# Patient Record
Sex: Female | Born: 1975 | Race: Asian | Hispanic: No | Marital: Married | Smoking: Never smoker
Health system: Southern US, Community
[De-identification: ages and names within clinical notes are randomized; demographics above are authoritative.]

## PROBLEM LIST (undated history)

## (undated) DIAGNOSIS — Z789 Other specified health status: Secondary | ICD-10-CM

## (undated) HISTORY — PX: NO PAST SURGERIES: SHX2092

---

## 2016-05-15 LAB — OB RESULTS CONSOLE HGB/HCT, BLOOD
HCT: 38 %
HEMOGLOBIN: 12.5 g/dL

## 2016-05-15 LAB — OB RESULTS CONSOLE HIV ANTIBODY (ROUTINE TESTING): HIV: NONREACTIVE

## 2016-05-15 LAB — OB RESULTS CONSOLE ABO/RH: RH Type: POSITIVE

## 2016-05-15 LAB — OB RESULTS CONSOLE PLATELET COUNT: PLATELETS: 289 10*3/uL

## 2016-05-15 LAB — GLUCOSE, 1 HOUR
Glucose 1 Hr Prenatal, POC: 143 mg/dL
Glucose 1 Hr Prenatal, POC: 143 mg/dL

## 2016-05-15 LAB — OB RESULTS CONSOLE RPR: RPR: NONREACTIVE

## 2016-05-15 LAB — OB RESULTS CONSOLE HEPATITIS B SURFACE ANTIGEN: Hepatitis B Surface Ag: NEGATIVE

## 2016-05-15 LAB — OB RESULTS CONSOLE ANTIBODY SCREEN: ANTIBODY SCREEN: NEGATIVE

## 2016-05-15 LAB — OB RESULTS CONSOLE RUBELLA ANTIBODY, IGM: RUBELLA: IMMUNE

## 2016-05-23 LAB — GLUCOSE, 1 HOUR
GLUCOSE 2 HR PP, POC: 131 mg/dL
GLUCOSE 3 HOUR GTT: 76 mg/dL (ref ?–140)
Glucose Tolerance, Fasting: 67

## 2016-06-19 NOTE — L&D Delivery Note (Signed)
Delivery Note At 8:27 AM a viable female was delivered via Vaginal, Spontaneous Delivery (Presentation: LOA;  ).  APGAR: 9, 9; weight 6 lb 3.7 oz (2825 g).   After 1 minute, the cord was clamped and cut. 40 units of pitocin diluted in 1000cc LR was infused rapidly IV.  The placenta separated spontaneously and delivered via CCT and maternal pushing effort.  It was inspected and appears to be intact with a 3 VC.  Anesthesia:  local Episiotomy: None Lacerations: 2nd degree;Perineal Suture Repair: 2-0 vicryl Est. Blood Loss (mL): 745   PPH d/t uterine atony.  Bladder emptied, IM pitocin, PR cytotec, IM metergine, and IV pitocin bolus,  Mom to postpartum.  Baby to Couplet care / Skin to Skin.  CRESENZO-DISHMAN,Mattheus Rauls 07/14/2016, 9:19 AM

## 2016-06-21 ENCOUNTER — Ambulatory Visit (INDEPENDENT_AMBULATORY_CARE_PROVIDER_SITE_OTHER): Payer: Medicaid Other | Admitting: Advanced Practice Midwife

## 2016-06-21 ENCOUNTER — Other Ambulatory Visit (HOSPITAL_COMMUNITY)
Admission: RE | Admit: 2016-06-21 | Discharge: 2016-06-21 | Disposition: A | Discharge status: Home / Self Care | Payer: Medicaid Other | Source: Ambulatory Visit | Attending: Advanced Practice Midwife | Admitting: Advanced Practice Midwife

## 2016-06-21 ENCOUNTER — Encounter: Payer: Self-pay | Admitting: Advanced Practice Midwife

## 2016-06-21 VITALS — BP 116/74 | HR 108 | Ht 59.0 in | Wt 131.6 lb

## 2016-06-21 DIAGNOSIS — O4703 False labor before 37 completed weeks of gestation, third trimester: Secondary | ICD-10-CM

## 2016-06-21 DIAGNOSIS — Z113 Encounter for screening for infections with a predominantly sexual mode of transmission: Secondary | ICD-10-CM | POA: Insufficient documentation

## 2016-06-21 DIAGNOSIS — O09899 Supervision of other high risk pregnancies, unspecified trimester: Secondary | ICD-10-CM

## 2016-06-21 DIAGNOSIS — O0933 Supervision of pregnancy with insufficient antenatal care, third trimester: Secondary | ICD-10-CM

## 2016-06-21 DIAGNOSIS — O09523 Supervision of elderly multigravida, third trimester: Secondary | ICD-10-CM

## 2016-06-21 DIAGNOSIS — O0930 Supervision of pregnancy with insufficient antenatal care, unspecified trimester: Secondary | ICD-10-CM | POA: Insufficient documentation

## 2016-06-21 DIAGNOSIS — O09529 Supervision of elderly multigravida, unspecified trimester: Secondary | ICD-10-CM

## 2016-06-21 DIAGNOSIS — Z3A33 33 weeks gestation of pregnancy: Secondary | ICD-10-CM

## 2016-06-21 LAB — POCT URINALYSIS DIP (DEVICE)
Bilirubin Urine: NEGATIVE
GLUCOSE, UA: NEGATIVE mg/dL
HGB URINE DIPSTICK: NEGATIVE
KETONES UR: NEGATIVE mg/dL
Nitrite: NEGATIVE
PROTEIN: NEGATIVE mg/dL
Urobilinogen, UA: 0.2 mg/dL (ref 0.0–1.0)
pH: 6.5 (ref 5.0–8.0)

## 2016-06-21 MED ORDER — CONCEPT OB 130-92.4-1 MG PO CAPS: 1.0000 | ORAL_CAPSULE | Freq: Every day | ORAL | 12 refills | Status: DC

## 2016-06-21 NOTE — Patient Instructions (Addendum)
Breastfeeding Deciding to breastfeed is one of the best choices you can make for you and your baby. A change in hormones during pregnancy causes your breast tissue to grow and increases the number and size of your milk ducts. These hormones also allow proteins, sugars, and fats from your blood supply to make breast milk in your milk-producing glands. Hormones prevent breast milk from being released before your baby is born as well as prompt milk flow after birth. Once breastfeeding has begun, thoughts of your baby, as well as his or her sucking or crying, can stimulate the release of milk from your milk-producing glands. Benefits of breastfeeding For Your Baby  Your first milk (colostrum) helps your baby's digestive system function better.  There are antibodies in your milk that help your baby fight off infections.  Your baby has a lower incidence of asthma, allergies, and sudden infant death syndrome.  The nutrients in breast milk are better for your baby than infant formulas and are designed uniquely for your baby's needs.  Breast milk improves your baby's brain development.  Your baby is less likely to develop other conditions, such as childhood obesity, asthma, or type 2 diabetes mellitus. For You  Breastfeeding helps to create a very special bond between you and your baby.  Breastfeeding is convenient. Breast milk is always available at the correct temperature and costs nothing.  Breastfeeding helps to burn calories and helps you lose the weight gained during pregnancy.  Breastfeeding makes your uterus contract to its prepregnancy size faster and slows bleeding (lochia) after you give birth.  Breastfeeding helps to lower your risk of developing type 2 diabetes mellitus, osteoporosis, and breast or ovarian cancer later in life. Signs that your baby is hungry Early Signs of Hunger  Increased alertness or activity.  Stretching.  Movement of the head from side to  side.  Movement of the head and opening of the mouth when the corner of the mouth or cheek is stroked (rooting).  Increased sucking sounds, smacking lips, cooing, sighing, or squeaking.  Hand-to-mouth movements.  Increased sucking of fingers or hands. Late Signs of Hunger  Fussing.  Intermittent crying. Extreme Signs of Hunger  Signs of extreme hunger will require calming and consoling before your baby will be able to breastfeed successfully. Do not wait for the following signs of extreme hunger to occur before you initiate breastfeeding:  Restlessness.  A loud, strong cry.  Screaming. Breastfeeding basics  Breastfeeding Initiation  Find a comfortable place to sit or lie down, with your neck and back well supported.  Place a pillow or rolled up blanket under your baby to bring him or her to the level of your breast (if you are seated). Nursing pillows are specially designed to help support your arms and your baby while you breastfeed.  Make sure that your baby's abdomen is facing your abdomen.  Gently massage your breast. With your fingertips, massage from your chest wall toward your nipple in a circular motion. This encourages milk flow. You may need to continue this action during the feeding if your milk flows slowly.  Support your breast with 4 fingers underneath and your thumb above your nipple. Make sure your fingers are well away from your nipple and your baby's mouth.  Stroke your baby's lips gently with your finger or nipple.  When your baby's mouth is open wide enough, quickly bring your baby to your breast, placing your entire nipple and as much of the colored area around your  nipple (areola) as possible into your baby's mouth.  More areola should be visible above your baby's upper lip than below the lower lip.  Your baby's tongue should be between his or her lower gum and your breast.  Ensure that your baby's mouth is correctly positioned around your nipple  (latched). Your baby's lips should create a seal on your breast and be turned out (everted).  It is common for your baby to suck about 2-3 minutes in order to start the flow of breast milk. Latching  Teaching your baby how to latch on to your breast properly is very important. An improper latch can cause nipple pain and decreased milk supply for you and poor weight gain in your baby. Also, if your baby is not latched onto your nipple properly, he or she may swallow some air during feeding. This can make your baby fussy. Burping your baby when you switch breasts during the feeding can help to get rid of the air. However, teaching your baby to latch on properly is still the best way to prevent fussiness from swallowing air while breastfeeding. Signs that your baby has successfully latched on to your nipple:  Silent tugging or silent sucking, without causing you pain.  Swallowing heard between every 3-4 sucks.  Muscle movement above and in front of his or her ears while sucking. Signs that your baby has not successfully latched on to nipple:  Sucking sounds or smacking sounds from your baby while breastfeeding.  Nipple pain. If you think your baby has not latched on correctly, slip your finger into the corner of your baby's mouth to break the suction and place it between your baby's gums. Attempt breastfeeding initiation again. Signs of Successful Breastfeeding  Signs from your baby:  A gradual decrease in the number of sucks or complete cessation of sucking.  Falling asleep.  Relaxation of his or her body.  Retention of a small amount of milk in his or her mouth.  Letting go of your breast by himself or herself. Signs from you:  Breasts that have increased in firmness, weight, and size 1-3 hours after feeding.  Breasts that are softer immediately after breastfeeding.  Increased milk volume, as well as a change in milk consistency and color by the fifth day of  breastfeeding.  Nipples that are not sore, cracked, or bleeding. Signs That Your Pecola Leisure is Getting Enough Milk  Wetting at least 1-2 diapers during the first 24 hours after birth.  Wetting at least 5-6 diapers every 24 hours for the first week after birth. The urine should be clear or pale yellow by 5 days after birth.  Wetting 6-8 diapers every 24 hours as your baby continues to grow and develop.  At least 3 stools in a 24-hour period by age 84 days. The stool should be soft and yellow.  At least 3 stools in a 24-hour period by age 19 days. The stool should be seedy and yellow.  No loss of weight greater than 10% of birth weight during the first 73 days of age.  Average weight gain of 4-7 ounces (113-198 g) per week after age 64 days.  Consistent daily weight gain by age 84 days, without weight loss after the age of 2 weeks. After a feeding, your baby may spit up a small amount. This is common. Breastfeeding frequency and duration Frequent feeding will help you make more milk and can prevent sore nipples and breast engorgement. Breastfeed when you feel the need to reduce  the fullness of your breasts or when your baby shows signs of hunger. This is called "breastfeeding on demand." Avoid introducing a pacifier to your baby while you are working to establish breastfeeding (the first 4-6 weeks after your baby is born). After this time you may choose to use a pacifier. Research has shown that pacifier use during the first year of a baby's life decreases the risk of sudden infant death syndrome (SIDS). Allow your baby to feed on each breast as long as he or she wants. Breastfeed until your baby is finished feeding. When your baby unlatches or falls asleep while feeding from the first breast, offer the second breast. Because newborns are often sleepy in the first few weeks of life, you may need to awaken your baby to get him or her to feed. Breastfeeding times will vary from baby to baby. However, the  following rules can serve as a guide to help you ensure that your baby is properly fed:  Newborns (babies 7 weeks of age or younger) may breastfeed every 1-3 hours.  Newborns should not go longer than 3 hours during the day or 5 hours during the night without breastfeeding.  You should breastfeed your baby a minimum of 8 times in a 24-hour period until you begin to introduce solid foods to your baby at around 66 months of age. Breast milk pumping Pumping and storing breast milk allows you to ensure that your baby is exclusively fed your breast milk, even at times when you are unable to breastfeed. This is especially important if you are going back to work while you are still breastfeeding or when you are not able to be present during feedings. Your lactation consultant can give you guidelines on how long it is safe to store breast milk. A breast pump is a machine that allows you to pump milk from your breast into a sterile bottle. The pumped breast milk can then be stored in a refrigerator or freezer. Some breast pumps are operated by hand, while others use electricity. Ask your lactation consultant which type will work best for you. Breast pumps can be purchased, but some hospitals and breastfeeding support groups lease breast pumps on a monthly basis. A lactation consultant can teach you how to hand express breast milk, if you prefer not to use a pump. Caring for your breasts while you breastfeed Nipples can become dry, cracked, and sore while breastfeeding. The following recommendations can help keep your breasts moisturized and healthy:  Avoid using soap on your nipples.  Wear a supportive bra. Although not required, special nursing bras and tank tops are designed to allow access to your breasts for breastfeeding without taking off your entire bra or top. Avoid wearing underwire-style bras or extremely tight bras.  Air dry your nipples for 3-23minutes after each feeding.  Use only cotton bra  pads to absorb leaked breast milk. Leaking of breast milk between feedings is normal.  Use lanolin on your nipples after breastfeeding. Lanolin helps to maintain your skin's normal moisture barrier. If you use pure lanolin, you do not need to wash it off before feeding your baby again. Pure lanolin is not toxic to your baby. You may also hand express a few drops of breast milk and gently massage that milk into your nipples and allow the milk to air dry. In the first few weeks after giving birth, some women experience extremely full breasts (engorgement). Engorgement can make your breasts feel heavy, warm, and tender to the  touch. Engorgement peaks within 3-5 days after you give birth. The following recommendations can help ease engorgement:  Completely empty your breasts while breastfeeding or pumping. You may want to start by applying warm, moist heat (in the shower or with warm water-soaked hand towels) just before feeding or pumping. This increases circulation and helps the milk flow. If your baby does not completely empty your breasts while breastfeeding, pump any extra milk after he or she is finished.  Wear a snug bra (nursing or regular) or tank top for 1-2 days to signal your body to slightly decrease milk production.  Apply ice packs to your breasts, unless this is too uncomfortable for you.  Make sure that your baby is latched on and positioned properly while breastfeeding. If engorgement persists after 48 hours of following these recommendations, contact your health care provider or a Advertising copywriter. Overall health care recommendations while breastfeeding  Eat healthy foods. Alternate between meals and snacks, eating 3 of each per day. Because what you eat affects your breast milk, some of the foods may make your baby more irritable than usual. Avoid eating these foods if you are sure that they are negatively affecting your baby.  Drink milk, fruit juice, and water to satisfy your  thirst (about 10 glasses a day).  Rest often, relax, and continue to take your prenatal vitamins to prevent fatigue, stress, and anemia.  Continue breast self-awareness checks.  Avoid chewing and smoking tobacco. Chemicals from cigarettes that pass into breast milk and exposure to secondhand smoke may harm your baby.  Avoid alcohol and drug use, including marijuana. Some medicines that may be harmful to your baby can pass through breast milk. It is important to ask your health care provider before taking any medicine, including all over-the-counter and prescription medicine as well as vitamin and herbal supplements. It is possible to become pregnant while breastfeeding. If birth control is desired, ask your health care provider about options that will be safe for your baby. Contact a health care provider if:  You feel like you want to stop breastfeeding or have become frustrated with breastfeeding.  You have painful breasts or nipples.  Your nipples are cracked or bleeding.  Your breasts are red, tender, or warm.  You have a swollen area on either breast.  You have a fever or chills.  You have nausea or vomiting.  You have drainage other than breast milk from your nipples.  Your breasts do not become full before feedings by the fifth day after you give birth.  You feel sad and depressed.  Your baby is too sleepy to eat well.  Your baby is having trouble sleeping.  Your baby is wetting less than 3 diapers in a 24-hour period.  Your baby has less than 3 stools in a 24-hour period.  Your baby's skin or the white part of his or her eyes becomes yellow.  Your baby is not gaining weight by 9 days of age. Get help right away if:  Your baby is overly tired (lethargic) and does not want to wake up and feed.  Your baby develops an unexplained fever. This information is not intended to replace advice given to you by your health care provider. Make sure you discuss any questions  you have with your health care provider. Document Released: 06/05/2005 Document Revised: 11/17/2015 Document Reviewed: 11/27/2012 Elsevier Interactive Patient Education  2017 [employer].   Contraception Choices Contraception (birth control) is the use of any methods or devices to  prevent pregnancy. Below are some methods to help avoid pregnancy. Hormonal methods  Contraceptive implant. This is a thin, plastic tube containing progesterone hormone. It does not contain estrogen hormone. Your health care provider inserts the tube in the inner part of the upper arm. The tube can remain in place for up to 3 years. After 3 years, the implant must be removed. The implant prevents the ovaries from releasing an egg (ovulation), thickens the cervical mucus to prevent sperm from entering the uterus, and thins the lining of the inside of the uterus.  Progesterone-only injections. These injections are given every 3 months by your health care provider to prevent pregnancy. This synthetic progesterone hormone stops the ovaries from releasing eggs. It also thickens cervical mucus and changes the uterine lining. This makes it harder for sperm to survive in the uterus.  Birth control pills. These pills contain estrogen and progesterone hormone. They work by preventing the ovaries from releasing eggs (ovulation). They also cause the cervical mucus to thicken, preventing the sperm from entering the uterus. Birth control pills are prescribed by a health care provider.Birth control pills can also be used to treat heavy periods.  Minipill. This type of birth control pill contains only the progesterone hormone. They are taken every day of each month and must be prescribed by your health care provider.  Birth control patch. The patch contains hormones similar to those in birth control pills. It must be changed once a week and is prescribed by a health care provider.  Vaginal ring. The ring contains hormones similar to  those in birth control pills. It is left in the vagina for 3 weeks, removed for 1 week, and then a new one is put back in place. The patient must be comfortable inserting and removing the ring from the vagina.A health care provider's prescription is necessary.  Emergency contraception. Emergency contraceptives prevent pregnancy after unprotected sexual intercourse. This pill can be taken right after sex or up to 5 days after unprotected sex. It is most effective the sooner you take the pills after having sexual intercourse. Most emergency contraceptive pills are available without a prescription. Check with your pharmacist. Do not use emergency contraception as your only form of birth control. Barrier methods  Female condom. This is a thin sheath (latex or rubber) that is worn over the penis during sexual intercourse. It can be used with spermicide to increase effectiveness.  Female condom. This is a soft, loose-fitting sheath that is put into the vagina before sexual intercourse.  Diaphragm. This is a soft, latex, dome-shaped barrier that must be fitted by a health care provider. It is inserted into the vagina, along with a spermicidal jelly. It is inserted before intercourse. The diaphragm should be left in the vagina for 6 to 8 hours after intercourse.  Cervical cap. This is a round, soft, latex or plastic cup that fits over the cervix and must be fitted by a health care provider. The cap can be left in place for up to 48 hours after intercourse.  Sponge. This is a soft, circular piece of polyurethane foam. The sponge has spermicide in it. It is inserted into the vagina after wetting it and before sexual intercourse.  Spermicides. These are chemicals that kill or block sperm from entering the cervix and uterus. They come in the form of creams, jellies, suppositories, foam, or tablets. They do not require a prescription. They are inserted into the vagina with an applicator before having sexual  intercourse. The  process must be repeated every time you have sexual intercourse. Intrauterine contraception  Intrauterine device (IUD). This is a T-shaped device that is put in a woman's uterus during a menstrual period to prevent pregnancy. There are 2 types:  Copper IUD. This type of IUD is wrapped in copper wire and is placed inside the uterus. Copper makes the uterus and fallopian tubes produce a fluid that kills sperm. It can stay in place for 10 years.  Hormone IUD. This type of IUD contains the hormone progestin (synthetic progesterone). The hormone thickens the cervical mucus and prevents sperm from entering the uterus, and it also thins the uterine lining to prevent implantation of a fertilized egg. The hormone can weaken or kill the sperm that get into the uterus. It can stay in place for 3-5 years, depending on which type of IUD is used. Permanent methods of contraception  Female tubal ligation. This is when the woman's fallopian tubes are surgically sealed, tied, or blocked to prevent the egg from traveling to the uterus.  Hysteroscopic sterilization. This involves placing a small coil or insert into each fallopian tube. Your doctor uses a technique called hysteroscopy to do the procedure. The device causes scar tissue to form. This results in permanent blockage of the fallopian tubes, so the sperm cannot fertilize the egg. It takes about 3 months after the procedure for the tubes to become blocked. You must use another form of birth control for these 3 months.  Female sterilization. This is when the female has the tubes that carry sperm tied off (vasectomy).This blocks sperm from entering the vagina during sexual intercourse. After the procedure, the man can still ejaculate fluid (semen). Natural planning methods  Natural family planning. This is not having sexual intercourse or using a barrier method (condom, diaphragm, cervical cap) on days the woman could become pregnant.  Calendar  method. This is keeping track of the length of each menstrual cycle and identifying when you are fertile.  Ovulation method. This is avoiding sexual intercourse during ovulation.  Symptothermal method. This is avoiding sexual intercourse during ovulation, using a thermometer and ovulation symptoms.  Post-ovulation method. This is timing sexual intercourse after you have ovulated. Regardless of which type or method of contraception you choose, it is important that you use condoms to protect against the transmission of sexually transmitted infections (STIs). Talk with your health care provider about which form of contraception is most appropriate for you. This information is not intended to replace advice given to you by your health care provider. Make sure you discuss any questions you have with your health care provider. Document Released: 06/05/2005 Document Revised: 11/11/2015 Document Reviewed: 11/28/2012 Elsevier Interactive Patient Education  2017 [employer].    AREA PEDIATRIC/FAMILY PRACTICE PHYSICIANS  [facility] [address] [place], [place]  [address] Phone - [phone]   Fax - [phone]  [facility] [address] [place], [place] [address] Phone - [phone]   Fax - [phone]  [facility] [address] [place], [place]  [address] Phone - [phone]   Fax - [phone]  [facility] [address] [place], [place]  [address] Phone - [phone]   Fax - [phone]  [facility] [address] [place], [place]  [address] Phone - [phone]   Fax - [phone]  [facility] [address] [place], [place]  [address] Phone - [phone]   Fax - [phone]  [facility] [address] [place], [place]  [address] Phone - [phone]   Fax - [phone]  [facility] AT [facility] [address] [place], [place]  [address] Phone - [phone]   Fax - [phone]  [facility] AT [facility] [address] [place], [place]  [address] Phone - [phone]   Fax - [phone] [facility] AT [place] [address] [place], [place]  [address] Phone - [phone]   Fax - [phone]  [facility] [address] [place], [place]  [address] Phone - [phone]   Fax - [phone]  [facility] [address] [place], [place]  [address] Phone - [phone]   Fax - [phone]  [facility] [place] [address] [place], [place]  [address] Phone - [phone]   Fax - [phone]  [facility] [address] [place], [place]  [address] Phone - [phone]  [facility] [address] [place], [place]  [address] Phone - [phone]   Fax - [phone]  [facility] [address] [place], [place]  [address] Phone - [phone]   Fax - [phone]  [facility] [address] [place], [place]  [address] Phone - [phone]   Fax - [phone]  [facility] [address] [place], [place]  [address] Phone - [phone]   Fax - [phone]   [facility] [address] [place], [place]  [address] Phone - [phone]   Fax - [phone]  [facility] [address] [place], [place]  [address] Phone - [phone]   Fax - [phone]  DAVID RUBIN [address] [place], [place]  [address] Phone - [phone]   Fax - [phone]  [facility] [address] [place], [place]  [address] Phone - [phone]   Fax - [phone]  [facility] [address] [place], [place]  [address] Phone - [phone]   Fax - [phone] Gerarda Fraction [address] [place], [place]  [address] Phone - [phone]   Fax - [phone]  [facility] [address] [place], [place]  [address] Phone - [phone]   Fax - [phone]  [facility] [place] [address] [place], [place]  [address] Phone - [phone]   Fax - [phone]

## 2016-06-21 NOTE — Progress Notes (Signed)
   PRENATAL VISIT NOTE  Subjective:  Laura Cuevas is a 41 y.o. G1P0 at 6344w2d by LMP and 15 week US per pt being seen today for NOB transfer of [place] from [place].Had limited PNC (2-3 visit) before moving to [place]. Records reviewed. All prenatal labs up to date except Patient did not keep appt for anatomy US. States last Pap was early 2017 in [place]. Denies HX abnl Pap. She is currently monitored for the following issues for this high-risk pregnancy and has Supervision of other high risk pregnancy, antepartum; AMA (advanced maternal age) multigravida 35+, unspecified trimester; and Limited prenatal care, antepartum on her problem list.  Patient reports occasional contractions and pelvic pressure, burning w/ urination.  Contractions: few Vag. Bleeding: None.  Movement: Present. Denies leaking of fluid.   The following portions of the patient's history were reviewed and updated as appropriate: allergies, current medications, past family history, past medical history, past social history, past surgical history and problem list. Problem list updated.  Objective:   Vitals:   06/21/16 0947 06/21/16 0949  BP: 116/74   Pulse: (!) 108   Weight: 131 lb 9.6 oz (59.7 kg)   Height:  4\' 11"  (1.499 m)    Fetal Status: Fetal Heart Rate (bpm): 145 Fundal Height: 33 cm Movement: Present  Presentation: Vertex  General:  Alert, oriented and cooperative. Patient is in no acute distress.  Skin: Skin is warm and dry. No rash noted.   Cardiovascular: Normal heart rate noted  Respiratory: Normal respiratory effort, no problems with respiration noted  Abdomen: Soft, gravid, appropriate for gestational age. Pain/Pressure: Present     Pelvic:  Cervical exam performed Dilation: Closed Effacement (%): 0 Station: -3. Moderate amount of thick white. Curd-like discharge C/W VVC  Extremities: Normal range of motion.  Edema: None  Mental Status: Normal mood and affect. Normal behavior. Normal judgment and thought content.    Assessment and Plan:  Pregnancy: G1P0 at 744w2d  1. Encounter for supervision of normal pregnancy, antepartum, unspecified gravidity  - Pain Mgmt, Profile 6 Conf w/o mM, U - US MFM OB DETAIL +14 WK; Future  2. Supervision of other high risk pregnancy, antepartum  - US MFM OB DETAIL +14 WK; Future  3. AMA (advanced maternal age) multigravida 35+, unspecified trimester  - US MFM OB DETAIL +14 WK; Future  4. Limited prenatal care, antepartum  - US MFM OB DETAIL +14 WK; Future  5. [redacted] weeks gestation of pregnancy  - US MFM OB DETAIL +14 WK; Future  6. Preterm uterine contractions in third trimester, antepartum - fFN collected, but discarded due to closed, long cervix - Wet prep, genital - GC[facility]Chlamydia probe amp [facility] - Culture, OB Urine  7. VVC - OTC Monistat PRN  Preterm labor symptoms and general obstetric precautions including but not limited to vaginal bleeding, contractions, leaking of fluid and fetal movement were reviewed in detail with the patient. Please refer to After Visit Summary for other counseling recommendations.   Return in 2 weeks (on 07/05/2016).   Dorathy KinsmanVirginia Ebunoluwa Gernert, CNM

## 2016-06-21 NOTE — Progress Notes (Signed)
Here for first visit. Transferring care . Given new patient information. Declines flu shop and tdap.

## 2016-06-22 LAB — PAIN MGMT, PROFILE 6 CONF W/O MM, U
6 ACETYLMORPHINE: NEGATIVE ng/mL (ref <10)
AMPHETAMINES: NEGATIVE ng/mL (ref <500)
Alcohol Metabolites: NEGATIVE ng/mL (ref <500)
BARBITURATES: NEGATIVE ng/mL (ref <300)
BENZODIAZEPINES: NEGATIVE ng/mL (ref <100)
COCAINE METABOLITE: NEGATIVE ng/mL (ref <150)
Creatinine: 37.8 mg/dL (ref >=20.0)
MARIJUANA METABOLITE: NEGATIVE ng/mL (ref <20)
METHADONE METABOLITE: NEGATIVE ng/mL (ref <100)
OXYCODONE: NEGATIVE ng/mL (ref <100)
Opiates: NEGATIVE ng/mL (ref <100)
Oxidant: NEGATIVE ug/mL (ref <200)
PHENCYCLIDINE: NEGATIVE ng/mL (ref <25)
Please note:: 0
pH: 6.56 (ref 4.5–9.0)

## 2016-06-22 LAB — WET PREP, GENITAL: Trich, Wet Prep: NONE SEEN

## 2016-06-22 LAB — GC/CHLAMYDIA PROBE AMP (~~LOC~~) NOT AT ARMC
Chlamydia: NEGATIVE
NEISSERIA GONORRHEA: NEGATIVE

## 2016-06-22 LAB — CULTURE, OB URINE: ORGANISM ID, BACTERIA: NO GROWTH

## 2016-06-23 ENCOUNTER — Other Ambulatory Visit: Payer: Self-pay | Admitting: Advanced Practice Midwife

## 2016-06-23 NOTE — Progress Notes (Unsigned)
Dx BV, VVC. Rx Terazol, Flagyl.

## 2016-06-26 ENCOUNTER — Telehealth: Payer: Self-pay | Admitting: *Deleted

## 2016-06-26 DIAGNOSIS — B9689 Other specified bacterial agents as the cause of diseases classified elsewhere: Secondary | ICD-10-CM

## 2016-06-26 DIAGNOSIS — B373 Candidiasis of vulva and vagina: Secondary | ICD-10-CM

## 2016-06-26 DIAGNOSIS — N760 Acute vaginitis: Secondary | ICD-10-CM

## 2016-06-26 DIAGNOSIS — B3731 Acute candidiasis of vulva and vagina: Secondary | ICD-10-CM

## 2016-06-26 MED ORDER — TERCONAZOLE 0.4 % VA CREA: 1.0000 | TOPICAL_CREAM | Freq: Every day | VAGINAL | 0 refills | Status: AC

## 2016-06-26 MED ORDER — METRONIDAZOLE 500 MG PO TABS: 500.0000 mg | ORAL_TABLET | Freq: Two times a day (BID) | ORAL | 0 refills | Status: DC

## 2016-06-26 NOTE — Telephone Encounter (Signed)
Per message from Laura Cuevas, Laura Cuevas need to call patient and notified of + BV and yeast and send prescriptions for flagyl and terazol to pharmacy of her choice ( no pharmacy on file) I called Laura Cuevas and she states Laura Cuevas told her about yeast at her ob visit. States she has not gotten the [facility] and wants me to send rx to phamacy which I did. She voices understanding.

## 2016-06-28 ENCOUNTER — Encounter (HOSPITAL_COMMUNITY): Payer: Self-pay

## 2016-06-28 ENCOUNTER — Ambulatory Visit (HOSPITAL_COMMUNITY)
Admission: RE | Admit: 2016-06-28 | Discharge: 2016-06-28 | Disposition: A | Discharge status: Home / Self Care | Payer: Medicaid Other | Source: Ambulatory Visit | Attending: Advanced Practice Midwife | Admitting: Advanced Practice Midwife

## 2016-06-28 DIAGNOSIS — O0933 Supervision of pregnancy with insufficient antenatal care, third trimester: Secondary | ICD-10-CM | POA: Insufficient documentation

## 2016-06-28 DIAGNOSIS — O09523 Supervision of elderly multigravida, third trimester: Secondary | ICD-10-CM | POA: Insufficient documentation

## 2016-06-28 DIAGNOSIS — O0930 Supervision of pregnancy with insufficient antenatal care, unspecified trimester: Secondary | ICD-10-CM

## 2016-06-28 DIAGNOSIS — O09899 Supervision of other high risk pregnancies, unspecified trimester: Secondary | ICD-10-CM

## 2016-06-28 DIAGNOSIS — Z3689 Encounter for other specified antenatal screening: Secondary | ICD-10-CM | POA: Insufficient documentation

## 2016-06-28 DIAGNOSIS — O09529 Supervision of elderly multigravida, unspecified trimester: Secondary | ICD-10-CM

## 2016-06-28 DIAGNOSIS — Z3A33 33 weeks gestation of pregnancy: Secondary | ICD-10-CM

## 2016-06-28 DIAGNOSIS — Z3A34 34 weeks gestation of pregnancy: Secondary | ICD-10-CM | POA: Diagnosis not present

## 2016-06-28 HISTORY — DX: Other specified health status: Z78.9

## 2016-07-05 ENCOUNTER — Encounter: Payer: Medicaid Other | Admitting: Family Medicine

## 2016-07-12 ENCOUNTER — Ambulatory Visit (INDEPENDENT_AMBULATORY_CARE_PROVIDER_SITE_OTHER): Payer: Medicaid Other | Admitting: Advanced Practice Midwife

## 2016-07-12 VITALS — BP 120/72 | HR 91 | Wt 136.0 lb

## 2016-07-12 DIAGNOSIS — Z3403 Encounter for supervision of normal first pregnancy, third trimester: Secondary | ICD-10-CM

## 2016-07-12 DIAGNOSIS — O09523 Supervision of elderly multigravida, third trimester: Secondary | ICD-10-CM

## 2016-07-12 DIAGNOSIS — O0993 Supervision of high risk pregnancy, unspecified, third trimester: Secondary | ICD-10-CM

## 2016-07-12 DIAGNOSIS — O09899 Supervision of other high risk pregnancies, unspecified trimester: Secondary | ICD-10-CM

## 2016-07-12 DIAGNOSIS — O0933 Supervision of pregnancy with insufficient antenatal care, third trimester: Secondary | ICD-10-CM | POA: Diagnosis not present

## 2016-07-12 DIAGNOSIS — O4703 False labor before 37 completed weeks of gestation, third trimester: Secondary | ICD-10-CM | POA: Diagnosis not present

## 2016-07-12 NOTE — Progress Notes (Signed)
   PRENATAL VISIT NOTE  Subjective:  Laura Cuevas is a 41 y.o. G1P0 at 4381w2d being seen today for ongoing prenatal care.  She is currently monitored for the following issues for this high-risk pregnancy and has Supervision of other high risk pregnancy, antepartum; AMA (advanced maternal age) multigravida 35+, unspecified trimester; and Limited prenatal care, antepartum on her problem list.  Patient reports occasional contractions.  Contractions: Not present. Vag. Bleeding: None.  Movement: Present. Denies leaking of fluid.   Plans to decline vaccines for baby. Wants to know what will be offered in hospital.   The following portions of the patient's history were reviewed and updated as appropriate: allergies, current medications, past family history, past medical history, past social history, past surgical history and problem list. Problem list updated.  Objective:   Vitals:   07/12/16 0938  BP: 120/72  Pulse: 91  Weight: 136 lb (61.7 kg)    Fetal Status: Fetal Heart Rate (bpm): 126 Fundal Height: 36 cm Movement: Present  Presentation: Vertex  General:  Alert, oriented and cooperative. Patient is in no acute distress.  Skin: Skin is warm and dry. No rash noted.   Cardiovascular: Normal heart rate noted  Respiratory: Normal respiratory effort, no problems with respiration noted  Abdomen: Soft, gravid, appropriate for gestational age. Pain/Pressure: Present     Pelvic:  Cervical exam performed Dilation: Closed Effacement (%): 0 Station: -3  Extremities: Normal range of motion.  Edema: None  Mental Status: Normal mood and affect. Normal behavior. Normal judgment and thought content.   Assessment and Plan:  Pregnancy: G1P0 at 6181w2d  1. Supervision of high risk pregnancy in third trimester  - Culture, beta strep (group b only)  2. Encounter for supervision of normal first pregnancy in third trimester   3. Supervision of other high risk pregnancy, antepartum   Term labor symptoms  and general obstetric precautions including but not limited to vaginal bleeding, contractions, leaking of fluid and fetal movement were reviewed in detail with the patient. Please refer to After Visit Summary for other counseling recommendations.  List of circ providers given.  Discussed Hep B vaccine, Vit K and E-mycin eye ointment in hospital. Explained that circ providers will likely refuse to perform elective circ soon after birth on baby that did not get Vit K due to bleeding risk Return in 1 week (on 07/19/2016).   Dorathy KinsmanVirginia Dimitri Dsouza, CNM

## 2016-07-12 NOTE — Patient Instructions (Addendum)
Breastfeeding Deciding to breastfeed is one of the best choices you can make for you and your baby. A change in hormones during pregnancy causes your breast tissue to grow and increases the number and size of your milk ducts. These hormones also allow proteins, sugars, and fats from your blood supply to make breast milk in your milk-producing glands. Hormones prevent breast milk from being released before your baby is born as well as prompt milk flow after birth. Once breastfeeding has begun, thoughts of your baby, as well as his or her sucking or crying, can stimulate the release of milk from your milk-producing glands. Benefits of breastfeeding For Your Baby  Your first milk (colostrum) helps your baby's digestive system function better.  There are antibodies in your milk that help your baby fight off infections.  Your baby has a lower incidence of asthma, allergies, and sudden infant death syndrome.  The nutrients in breast milk are better for your baby than infant formulas and are designed uniquely for your baby's needs.  Breast milk improves your baby's brain development.  Your baby is less likely to develop other conditions, such as childhood obesity, asthma, or type 2 diabetes mellitus. For You  Breastfeeding helps to create a very special bond between you and your baby.  Breastfeeding is convenient. Breast milk is always available at the correct temperature and costs nothing.  Breastfeeding helps to burn calories and helps you lose the weight gained during pregnancy.  Breastfeeding makes your uterus contract to its prepregnancy size faster and slows bleeding (lochia) after you give birth.  Breastfeeding helps to lower your risk of developing type 2 diabetes mellitus, osteoporosis, and breast or ovarian cancer later in life. Signs that your baby is hungry Early Signs of Hunger  Increased alertness or activity.  Stretching.  Movement of the head from side to  side.  Movement of the head and opening of the mouth when the corner of the mouth or cheek is stroked (rooting).  Increased sucking sounds, smacking lips, cooing, sighing, or squeaking.  Hand-to-mouth movements.  Increased sucking of fingers or hands. Late Signs of Hunger  Fussing.  Intermittent crying. Extreme Signs of Hunger  Signs of extreme hunger will require calming and consoling before your baby will be able to breastfeed successfully. Do not wait for the following signs of extreme hunger to occur before you initiate breastfeeding:  Restlessness.  A loud, strong cry.  Screaming. Breastfeeding basics  Breastfeeding Initiation  Find a comfortable place to sit or lie down, with your neck and back well supported.  Place a pillow or rolled up blanket under your baby to bring him or her to the level of your breast (if you are seated). Nursing pillows are specially designed to help support your arms and your baby while you breastfeed.  Make sure that your baby's abdomen is facing your abdomen.  Gently massage your breast. With your fingertips, massage from your chest wall toward your nipple in a circular motion. This encourages milk flow. You may need to continue this action during the feeding if your milk flows slowly.  Support your breast with 4 fingers underneath and your thumb above your nipple. Make sure your fingers are well away from your nipple and your baby's mouth.  Stroke your baby's lips gently with your finger or nipple.  When your baby's mouth is open wide enough, quickly bring your baby to your breast, placing your entire nipple and as much of the colored area around your  nipple (areola) as possible into your baby's mouth.  More areola should be visible above your baby's upper lip than below the lower lip.  Your baby's tongue should be between his or her lower gum and your breast.  Ensure that your baby's mouth is correctly positioned around your nipple  (latched). Your baby's lips should create a seal on your breast and be turned out (everted).  It is common for your baby to suck about 2-3 minutes in order to start the flow of breast milk. Latching  Teaching your baby how to latch on to your breast properly is very important. An improper latch can cause nipple pain and decreased milk supply for you and poor weight gain in your baby. Also, if your baby is not latched onto your nipple properly, he or she may swallow some air during feeding. This can make your baby fussy. Burping your baby when you switch breasts during the feeding can help to get rid of the air. However, teaching your baby to latch on properly is still the best way to prevent fussiness from swallowing air while breastfeeding. Signs that your baby has successfully latched on to your nipple:  Silent tugging or silent sucking, without causing you pain.  Swallowing heard between every 3-4 sucks.  Muscle movement above and in front of his or her ears while sucking. Signs that your baby has not successfully latched on to nipple:  Sucking sounds or smacking sounds from your baby while breastfeeding.  Nipple pain. If you think your baby has not latched on correctly, slip your finger into the corner of your baby's mouth to break the suction and place it between your baby's gums. Attempt breastfeeding initiation again. Signs of Successful Breastfeeding  Signs from your baby:  A gradual decrease in the number of sucks or complete cessation of sucking.  Falling asleep.  Relaxation of his or her body.  Retention of a small amount of milk in his or her mouth.  Letting go of your breast by himself or herself. Signs from you:  Breasts that have increased in firmness, weight, and size 1-3 hours after feeding.  Breasts that are softer immediately after breastfeeding.  Increased milk volume, as well as a change in milk consistency and color by the fifth day of  breastfeeding.  Nipples that are not sore, cracked, or bleeding. Signs That Your Pecola Leisure is Getting Enough Milk  Wetting at least 1-2 diapers during the first 24 hours after birth.  Wetting at least 5-6 diapers every 24 hours for the first week after birth. The urine should be clear or pale yellow by 5 days after birth.  Wetting 6-8 diapers every 24 hours as your baby continues to grow and develop.  At least 3 stools in a 24-hour period by age 84 days. The stool should be soft and yellow.  At least 3 stools in a 24-hour period by age 19 days. The stool should be seedy and yellow.  No loss of weight greater than 10% of birth weight during the first 73 days of age.  Average weight gain of 4-7 ounces (113-198 g) per week after age 64 days.  Consistent daily weight gain by age 84 days, without weight loss after the age of 2 weeks. After a feeding, your baby may spit up a small amount. This is common. Breastfeeding frequency and duration Frequent feeding will help you make more milk and can prevent sore nipples and breast engorgement. Breastfeed when you feel the need to reduce  the fullness of your breasts or when your baby shows signs of hunger. This is called "breastfeeding on demand." Avoid introducing a pacifier to your baby while you are working to establish breastfeeding (the first 4-6 weeks after your baby is born). After this time you may choose to use a pacifier. Research has shown that pacifier use during the first year of a baby's life decreases the risk of sudden infant death syndrome (SIDS). Allow your baby to feed on each breast as long as he or she wants. Breastfeed until your baby is finished feeding. When your baby unlatches or falls asleep while feeding from the first breast, offer the second breast. Because newborns are often sleepy in the first few weeks of life, you may need to awaken your baby to get him or her to feed. Breastfeeding times will vary from baby to baby. However, the  following rules can serve as a guide to help you ensure that your baby is properly fed:  Newborns (babies 7 weeks of age or younger) may breastfeed every 1-3 hours.  Newborns should not go longer than 3 hours during the day or 5 hours during the night without breastfeeding.  You should breastfeed your baby a minimum of 8 times in a 24-hour period until you begin to introduce solid foods to your baby at around 66 months of age. Breast milk pumping Pumping and storing breast milk allows you to ensure that your baby is exclusively fed your breast milk, even at times when you are unable to breastfeed. This is especially important if you are going back to work while you are still breastfeeding or when you are not able to be present during feedings. Your lactation consultant can give you guidelines on how long it is safe to store breast milk. A breast pump is a machine that allows you to pump milk from your breast into a sterile bottle. The pumped breast milk can then be stored in a refrigerator or freezer. Some breast pumps are operated by hand, while others use electricity. Ask your lactation consultant which type will work best for you. Breast pumps can be purchased, but some hospitals and breastfeeding support groups lease breast pumps on a monthly basis. A lactation consultant can teach you how to hand express breast milk, if you prefer not to use a pump. Caring for your breasts while you breastfeed Nipples can become dry, cracked, and sore while breastfeeding. The following recommendations can help keep your breasts moisturized and healthy:  Avoid using soap on your nipples.  Wear a supportive bra. Although not required, special nursing bras and tank tops are designed to allow access to your breasts for breastfeeding without taking off your entire bra or top. Avoid wearing underwire-style bras or extremely tight bras.  Air dry your nipples for 3-23minutes after each feeding.  Use only cotton bra  pads to absorb leaked breast milk. Leaking of breast milk between feedings is normal.  Use lanolin on your nipples after breastfeeding. Lanolin helps to maintain your skin's normal moisture barrier. If you use pure lanolin, you do not need to wash it off before feeding your baby again. Pure lanolin is not toxic to your baby. You may also hand express a few drops of breast milk and gently massage that milk into your nipples and allow the milk to air dry. In the first few weeks after giving birth, some women experience extremely full breasts (engorgement). Engorgement can make your breasts feel heavy, warm, and tender to the  touch. Engorgement peaks within 3-5 days after you give birth. The following recommendations can help ease engorgement:  Completely empty your breasts while breastfeeding or pumping. You may want to start by applying warm, moist heat (in the shower or with warm water-soaked hand towels) just before feeding or pumping. This increases circulation and helps the milk flow. If your baby does not completely empty your breasts while breastfeeding, pump any extra milk after he or she is finished.  Wear a snug bra (nursing or regular) or tank top for 1-2 days to signal your body to slightly decrease milk production.  Apply ice packs to your breasts, unless this is too uncomfortable for you.  Make sure that your baby is latched on and positioned properly while breastfeeding. If engorgement persists after 48 hours of following these recommendations, contact your health care provider or a Advertising copywriter. Overall health care recommendations while breastfeeding  Eat healthy foods. Alternate between meals and snacks, eating 3 of each per day. Because what you eat affects your breast milk, some of the foods may make your baby more irritable than usual. Avoid eating these foods if you are sure that they are negatively affecting your baby.  Drink milk, fruit juice, and water to satisfy your  thirst (about 10 glasses a day).  Rest often, relax, and continue to take your prenatal vitamins to prevent fatigue, stress, and anemia.  Continue breast self-awareness checks.  Avoid chewing and smoking tobacco. Chemicals from cigarettes that pass into breast milk and exposure to secondhand smoke may harm your baby.  Avoid alcohol and drug use, including marijuana. Some medicines that may be harmful to your baby can pass through breast milk. It is important to ask your health care provider before taking any medicine, including all over-the-counter and prescription medicine as well as vitamin and herbal supplements. It is possible to become pregnant while breastfeeding. If birth control is desired, ask your health care provider about options that will be safe for your baby. Contact a health care provider if:  You feel like you want to stop breastfeeding or have become frustrated with breastfeeding.  You have painful breasts or nipples.  Your nipples are cracked or bleeding.  Your breasts are red, tender, or warm.  You have a swollen area on either breast.  You have a fever or chills.  You have nausea or vomiting.  You have drainage other than breast milk from your nipples.  Your breasts do not become full before feedings by the fifth day after you give birth.  You feel sad and depressed.  Your baby is too sleepy to eat well.  Your baby is having trouble sleeping.  Your baby is wetting less than 3 diapers in a 24-hour period.  Your baby has less than 3 stools in a 24-hour period.  Your baby's skin or the white part of his or her eyes becomes yellow.  Your baby is not gaining weight by 66 days of age. Get help right away if:  Your baby is overly tired (lethargic) and does not want to wake up and feed.  Your baby develops an unexplained fever. This information is not intended to replace advice given to you by your health care provider. Make sure you discuss any questions  you have with your health care provider. Document Released: 06/05/2005 Document Revised: 11/17/2015 Document Reviewed: 11/27/2012 Elsevier Interactive Patient Education  2017 [employer].   Places to have your son circumcised:    [facility] [phone] by  4 wks  [facility] [phone] $244 by 4 wks  [facility] [phone] $175 by 2 wks  [facility] [phone] $250 by 7 days [facility] [phone] $150 by 4 wks  These prices sometimes change but are roughly what you can expect to pay. Please call and confirm pricing.   Circumcision is considered an elective/non-medically necessary procedure. There are many reasons parents decide to have their sons circumsized. During the first year of life circumcised males have a reduced risk of urinary tract infections but after this year the rates between circumcised males and uncircumcised males are the same.  It is safe to have your son circumcised outside of the hospital and the places above perform them regularly.

## 2016-07-14 ENCOUNTER — Inpatient Hospital Stay (HOSPITAL_COMMUNITY)
Admission: AD | Admit: 2016-07-14 | Discharge: 2016-07-16 | DRG: 774 | Disposition: A | Discharge status: Home / Self Care | Payer: Medicaid Other | Source: Ambulatory Visit | Attending: Family Medicine | Admitting: Family Medicine

## 2016-07-14 ENCOUNTER — Encounter (HOSPITAL_COMMUNITY): Payer: Self-pay | Admitting: *Deleted

## 2016-07-14 DIAGNOSIS — O0930 Supervision of pregnancy with insufficient antenatal care, unspecified trimester: Secondary | ICD-10-CM

## 2016-07-14 DIAGNOSIS — O42013 Preterm premature rupture of membranes, onset of labor within 24 hours of rupture, third trimester: Secondary | ICD-10-CM | POA: Diagnosis not present

## 2016-07-14 DIAGNOSIS — O09529 Supervision of elderly multigravida, unspecified trimester: Secondary | ICD-10-CM

## 2016-07-14 DIAGNOSIS — Z833 Family history of diabetes mellitus: Secondary | ICD-10-CM | POA: Diagnosis not present

## 2016-07-14 DIAGNOSIS — Z3A36 36 weeks gestation of pregnancy: Secondary | ICD-10-CM | POA: Diagnosis not present

## 2016-07-14 DIAGNOSIS — O09899 Supervision of other high risk pregnancies, unspecified trimester: Secondary | ICD-10-CM

## 2016-07-14 LAB — CBC
HCT: 38.1 % (ref 36.0–46.0)
HCT: 30.7 % — ABNORMAL LOW (ref 36.0–46.0)
HEMATOCRIT: 33.5 % — AB (ref 36.0–46.0)
HEMOGLOBIN: 10.8 g/dL — AB (ref 12.0–15.0)
Hemoglobin: 13 g/dL (ref 12.0–15.0)
Hemoglobin: 11.7 g/dL — ABNORMAL LOW (ref 12.0–15.0)
MCH: 30 pg (ref 26.0–34.0)
MCH: 30.5 pg (ref 26.0–34.0)
MCH: 31 pg (ref 26.0–34.0)
MCHC: 34.1 g/dL (ref 30.0–36.0)
MCHC: 34.9 g/dL (ref 30.0–36.0)
MCHC: 35.2 g/dL (ref 30.0–36.0)
MCV: 87.8 fL (ref 78.0–100.0)
MCV: 87.5 fL (ref 78.0–100.0)
MCV: 88.2 fL (ref 78.0–100.0)
PLATELETS: 278 10*3/uL (ref 150–400)
PLATELETS: 265 10*3/uL (ref 150–400)
Platelets: 242 10*3/uL (ref 150–400)
RBC: 4.34 MIL/uL (ref 3.87–5.11)
RBC: 3.83 MIL/uL — AB (ref 3.87–5.11)
RBC: 3.48 MIL/uL — ABNORMAL LOW (ref 3.87–5.11)
RDW: 14 % (ref 11.5–15.5)
RDW: 14.1 % (ref 11.5–15.5)
RDW: 14.1 % (ref 11.5–15.5)
WBC: 16.4 10*3/uL — AB (ref 4.0–10.5)
WBC: 20.5 10*3/uL — AB (ref 4.0–10.5)
WBC: 21 10*3/uL — ABNORMAL HIGH (ref 4.0–10.5)

## 2016-07-14 LAB — CULTURE, BETA STREP (GROUP B ONLY)

## 2016-07-14 LAB — RPR: RPR: NONREACTIVE

## 2016-07-14 LAB — TYPE AND SCREEN
ABO/RH(D): O POS
Antibody Screen: NEGATIVE

## 2016-07-14 LAB — ABO/RH: ABO/RH(D): O POS

## 2016-07-14 MED ORDER — OXYCODONE-ACETAMINOPHEN 5-325 MG PO TABS: 2.0000 | ORAL_TABLET | ORAL | Status: DC | PRN

## 2016-07-14 MED ORDER — OXYTOCIN 40 UNITS IN LACTATED RINGERS INFUSION - SIMPLE MED: 10.0000 [IU]/h | INTRAVENOUS | Status: DC

## 2016-07-14 MED ORDER — METHYLERGONOVINE MALEATE 0.2 MG/ML IJ SOLN
0.2000 mg | Freq: Once | INTRAMUSCULAR | Status: AC
  Administered 2016-07-14: 0.2 mg via INTRAMUSCULAR

## 2016-07-14 MED ORDER — PRENATAL MULTIVITAMIN CH
1.0000 | ORAL_TABLET | Freq: Every day | ORAL | Status: DC
  Administered 2016-07-14 – 2016-07-16 (×3): 1 via ORAL
  Filled 2016-07-14 (×3): qty 1

## 2016-07-14 MED ORDER — ONDANSETRON HCL 4 MG/2ML IJ SOLN: 4.0000 mg | Freq: Four times a day (QID) | INTRAMUSCULAR | Status: DC | PRN

## 2016-07-14 MED ORDER — ZOLPIDEM TARTRATE 5 MG PO TABS: 5.0000 mg | ORAL_TABLET | Freq: Every evening | ORAL | Status: DC | PRN

## 2016-07-14 MED ORDER — METHYLERGONOVINE MALEATE 0.2 MG/ML IJ SOLN: 0.2000 mg | INTRAMUSCULAR | Status: DC | PRN

## 2016-07-14 MED ORDER — MISOPROSTOL 200 MCG PO TABS: 800.0000 ug | ORAL_TABLET | Freq: Once | ORAL | Status: DC

## 2016-07-14 MED ORDER — OXYTOCIN 10 UNIT/ML IJ SOLN
10.0000 [IU] | Freq: Once | INTRAMUSCULAR | Status: DC | PRN
  Filled 2016-07-14: qty 1

## 2016-07-14 MED ORDER — DOCUSATE SODIUM 100 MG PO CAPS
100.0000 mg | ORAL_CAPSULE | Freq: Two times a day (BID) | ORAL | Status: DC
  Administered 2016-07-14 – 2016-07-15 (×3): 100 mg via ORAL
  Filled 2016-07-14 (×3): qty 1

## 2016-07-14 MED ORDER — MEASLES, MUMPS & RUBELLA VAC ~~LOC~~ INJ
0.5000 mL | INJECTION | Freq: Once | SUBCUTANEOUS | Status: DC
  Filled 2016-07-14: qty 0.5

## 2016-07-14 MED ORDER — FLEET ENEMA 7-19 GM/118ML RE ENEM: 1.0000 | ENEMA | Freq: Every day | RECTAL | Status: DC | PRN

## 2016-07-14 MED ORDER — DIPHENHYDRAMINE HCL 25 MG PO CAPS: 25.0000 mg | ORAL_CAPSULE | Freq: Four times a day (QID) | ORAL | Status: DC | PRN

## 2016-07-14 MED ORDER — ONDANSETRON HCL 4 MG/2ML IJ SOLN: 4.0000 mg | INTRAMUSCULAR | Status: DC | PRN

## 2016-07-14 MED ORDER — OXYTOCIN 40 UNITS IN LACTATED RINGERS INFUSION - SIMPLE MED: 2.5000 [IU]/h | INTRAVENOUS | Status: DC

## 2016-07-14 MED ORDER — OXYTOCIN BOLUS FROM INFUSION
500.0000 mL | Freq: Once | INTRAVENOUS | Status: AC
  Administered 2016-07-14: 500 mL via INTRAVENOUS

## 2016-07-14 MED ORDER — OXYTOCIN 10 UNIT/ML IJ SOLN
INTRAMUSCULAR | Status: AC
  Administered 2016-07-14: 10 [IU]
  Filled 2016-07-14: qty 1

## 2016-07-14 MED ORDER — LIDOCAINE HCL (PF) 1 % IJ SOLN
30.0000 mL | INTRAMUSCULAR | Status: AC | PRN
  Administered 2016-07-14: 30 mL via SUBCUTANEOUS
  Filled 2016-07-14: qty 30

## 2016-07-14 MED ORDER — ACETAMINOPHEN 325 MG PO TABS: 650.0000 mg | ORAL_TABLET | ORAL | Status: DC | PRN

## 2016-07-14 MED ORDER — OXYCODONE HCL 5 MG PO TABS: 5.0000 mg | ORAL_TABLET | ORAL | Status: DC | PRN

## 2016-07-14 MED ORDER — IBUPROFEN 600 MG PO TABS
600.0000 mg | ORAL_TABLET | Freq: Four times a day (QID) | ORAL | Status: DC
  Administered 2016-07-14 – 2016-07-16 (×9): 600 mg via ORAL
  Filled 2016-07-14 (×9): qty 1

## 2016-07-14 MED ORDER — METHYLERGONOVINE MALEATE 0.2 MG/ML IJ SOLN: 0.2000 mg | Freq: Once | INTRAMUSCULAR | Status: DC

## 2016-07-14 MED ORDER — METHYLERGONOVINE MALEATE 0.2 MG PO TABS: 0.2000 mg | ORAL_TABLET | ORAL | Status: DC | PRN

## 2016-07-14 MED ORDER — DIBUCAINE 1 % RE OINT: 1.0000 "application " | TOPICAL_OINTMENT | RECTAL | Status: DC | PRN

## 2016-07-14 MED ORDER — OXYCODONE-ACETAMINOPHEN 5-325 MG PO TABS: 1.0000 | ORAL_TABLET | ORAL | Status: DC | PRN

## 2016-07-14 MED ORDER — OXYCODONE HCL 5 MG PO TABS: 10.0000 mg | ORAL_TABLET | ORAL | Status: DC | PRN

## 2016-07-14 MED ORDER — LACTATED RINGERS IV SOLN: 500.0000 mL | INTRAVENOUS | Status: DC | PRN

## 2016-07-14 MED ORDER — SOD CITRATE-CITRIC ACID 500-334 MG/5ML PO SOLN: 30.0000 mL | ORAL | Status: DC | PRN

## 2016-07-14 MED ORDER — SIMETHICONE 80 MG PO CHEW: 80.0000 mg | CHEWABLE_TABLET | ORAL | Status: DC | PRN

## 2016-07-14 MED ORDER — FERROUS SULFATE 325 (65 FE) MG PO TABS
325.0000 mg | ORAL_TABLET | Freq: Two times a day (BID) | ORAL | Status: DC
  Administered 2016-07-14 – 2016-07-15 (×3): 325 mg via ORAL
  Filled 2016-07-14 (×3): qty 1

## 2016-07-14 MED ORDER — ACETAMINOPHEN 325 MG PO TABS
650.0000 mg | ORAL_TABLET | ORAL | Status: DC | PRN
  Administered 2016-07-14 (×2): 650 mg via ORAL
  Filled 2016-07-14 (×2): qty 2

## 2016-07-14 MED ORDER — COCONUT OIL OIL: 1.0000 "application " | TOPICAL_OIL | Status: DC | PRN

## 2016-07-14 MED ORDER — TETANUS-DIPHTH-ACELL PERTUSSIS 5-2.5-18.5 LF-MCG/0.5 IM SUSP: 0.5000 mL | Freq: Once | INTRAMUSCULAR | Status: DC

## 2016-07-14 MED ORDER — LACTATED RINGERS IV BOLUS (SEPSIS): 500.0000 mL | Freq: Once | INTRAVENOUS | Status: DC

## 2016-07-14 MED ORDER — WITCH HAZEL-GLYCERIN EX PADS: 1.0000 "application " | MEDICATED_PAD | CUTANEOUS | Status: DC | PRN

## 2016-07-14 MED ORDER — LACTATED RINGERS IV SOLN: INTRAVENOUS | Status: DC

## 2016-07-14 MED ORDER — BISACODYL 10 MG RE SUPP: 10.0000 mg | Freq: Every day | RECTAL | Status: DC | PRN

## 2016-07-14 MED ORDER — ONDANSETRON HCL 4 MG PO TABS: 4.0000 mg | ORAL_TABLET | ORAL | Status: DC | PRN

## 2016-07-14 MED ORDER — BETAMETHASONE SOD PHOS & ACET 6 (3-3) MG/ML IJ SUSP
12.0000 mg | Freq: Once | INTRAMUSCULAR | Status: DC
  Filled 2016-07-14: qty 2

## 2016-07-14 MED ORDER — BENZOCAINE-MENTHOL 20-0.5 % EX AERO
1.0000 "application " | INHALATION_SPRAY | CUTANEOUS | Status: DC | PRN
  Filled 2016-07-14: qty 56

## 2016-07-14 MED ORDER — MISOPROSTOL 200 MCG PO TABS
ORAL_TABLET | ORAL | Status: AC
  Administered 2016-07-14: 800 ug
  Filled 2016-07-14: qty 4

## 2016-07-14 MED ORDER — OXYTOCIN 10 UNIT/ML IJ SOLN
INTRAMUSCULAR | Status: AC
  Filled 2016-07-14: qty 2

## 2016-07-14 NOTE — [facility] Note (Signed)
Pt presents to [facility] with complaints of contractions that started this morning at 0500 this morning. Reports small amount of vaginal bleeding, denies LOF

## 2016-07-14 NOTE — H&P (Signed)
LABOR AND DELIVERY ADMISSION HISTORY AND PHYSICAL NOTE  Laura Cuevas is a 41 y.o. female G1P0000 with IUP at [redacted]w[redacted]d by LMP and confirmed with 15 week ultrasound presenting for spontaneous onset of labor and found to be in active labor with 9cm dilation.   She reported positive fetal movement. She had SROM with leakage of fluid. No vaginal bleeding.  Prenatal History/Complications: None  Past Medical History: Past Medical History:  Diagnosis Date  . Medical history non-contributory     Past Surgical History: Past Surgical History:  Procedure Laterality Date  . NO PAST SURGERIES      Obstetrical History: OB History    Gravida Para Term Preterm AB Living   1 0 0 0 0 0   SAB TAB Ectopic Multiple Live Births   0 0 0 0 0      Social History: Social History   Social History  . Marital status: Married    Spouse name: N/A  . Number of children: N/A  . Years of education: N/A   Social History Main Topics  . Smoking status: Never Smoker  . Smokeless tobacco: Never Used  . Alcohol use No  . Drug use: No  . Sexual activity: Yes    Birth control/ protection: None   Other Topics Concern  . None   Social History Narrative  . None    Family History: Family History  Problem Relation Age of Onset  . Thyroid cancer Mother   . Diabetes Father     Allergies: No Known Allergies  Prescriptions Prior to Admission  Medication Sig Dispense Refill Last Dose  . metroNIDAZOLE (FLAGYL) 500 MG tablet Take 1 tablet (500 mg total) by mouth 2 (two) times daily. (Patient not taking: Reported on 07/12/2016) 14 tablet 0 Not Taking  . Prenat w/o A Vit-FeFum-FePo-FA (CONCEPT OB) 130-92.4-1 MG CAPS Take 1 tablet by mouth daily. 30 capsule 12 Taking  . Prenatal Multivit-Min-Fe-FA (PRENATAL VITAMINS PO) Take 1 tablet by mouth daily.   Taking     Review of Systems   All systems reviewed and negative except as stated in HPI  Blood pressure 109/71, pulse 89, temperature 98.2 F (36.8 C),  temperature source Oral, resp. rate 20, height 4\' 11"  (1.499 m), weight 136 lb (61.7 kg), last menstrual period 11/01/2015. General appearance: alert, cooperative and no distress Lungs: clear to auscultation bilaterally Heart: regular rate and rhythm Abdomen: soft, non-tender; bowel sounds normal Extremities: No calf swelling or tenderness Presentation: cephalic Fetal monitoring: FHT limited given precipitous delivery. Baseline appears to be in 120s, +accels, - decels, mod variability Uterine activity: ctx q1-2 min Dilation: 10 Effacement (%): 100 Station: +2 Exam by:: rgagnon rnc   Prenatal labs: ABO, Rh: --/--/O POS (01/26 0840) Antibody: PENDING (01/26 0840) Rubella: !Error! RPR: Nonreactive (11/27 0000)  HBsAg: Negative (11/27 0000)  HIV: Non-reactive (11/27 0000)  GBS:    1 hr Glucola: Elevated in 3rd trimester, but normal 3hr GTT Genetic screening:  Too late to prenatal care Anatomy US: Normal  Prenatal Transfer Tool  Maternal Diabetes: No Genetic Screening: Declined Maternal Ultrasounds/Referrals: Normal Fetal Ultrasounds or other Referrals:  None Maternal Substance Abuse:  No Significant Maternal Medications:  None Significant Maternal Lab Results: None  Results for orders placed or performed during the hospital encounter of 07/14/16 (from the past 24 hour(s))  CBC   Collection Time: 07/14/16  8:40 AM  Result Value Ref Range   WBC 16.4 (H) 4.0 - 10.5 K/uL   RBC 4.34 3.87 -  5.11 MIL/uL   Hemoglobin 13.0 12.0 - 15.0 g/dL   HCT 09.838.1 11.936.0 - 14.746.0 %   MCV 87.8 78.0 - 100.0 fL   MCH 30.0 26.0 - 34.0 pg   MCHC 34.1 30.0 - 36.0 g/dL   RDW 82.914.0 56.211.5 - 13.015.5 %   Platelets 278 150 - 400 K/uL  Type and screen [facility]   Collection Time: 07/14/16  8:40 AM  Result Value Ref Range   ABO/RH(D) O POS    Antibody Screen PENDING    Sample Expiration 07/17/2016     Patient Active Problem List   Diagnosis Date Noted  . Labor and delivery indication  for care or intervention 07/14/2016  . Supervision of other high risk pregnancy, antepartum 06/21/2016  . AMA (advanced maternal age) multigravida 35+, unspecified trimester 06/21/2016  . Limited prenatal care, antepartum 06/21/2016    Assessment: Laura FeltHelen Cuevas is a 41 y.o. G1P0000 at 7680w4d here for SOL and SROM with precipitous delivery.   #labor: Admitted for expectant management. Will attempt administration of betamtheasone prior to delivery.  #Pain: Limited given expectant delivery.  #FWB: Cat I from what has been evaluated #ID:  GBS culture currently pending. Did not receive antibiotics secondary to precipitous delivery #MOF: Breast #MOC:Undecided  #Circ:  Ouptatient  Lise AuerMegan C Campbell, MD PGY-2 07/14/2016, 9:35 AM   I have seen and examined this patient and agree with the management plan.

## 2016-07-14 NOTE — Lactation Note (Signed)
This note was copied from a baby's chart. Lactation Consultation Note: Initial visit with this 36.4 week baby. Baby now 6 hours old. Mom had baby latched to breast when I went into room. Assisted her with pillows for comfort. Reports tugging, no pain. He nursed for 20 min . Reviewed hand expression. Latched to other breast and nursed for 5 min then going off to sleep. DEBP setup with instructions for setup, use and cleaning of pump pieces Mom to go to BR then pump. BF brochure given. Reviewed our phone number, OP appointments and BFSG as resources for support after DC. To call for assist prn  Patient Name: Laura [name] Today's Date: 07/14/2016 Reason for consult: Initial assessment;Late preterm infant   Maternal Data Formula Feeding for Exclusion: No Has patient been taught Hand Expression?: Yes Does the patient have breastfeeding experience prior to this delivery?: No  Feeding Feeding Type: Breast Fed Length of feed: 25 min  LATCH Score/Interventions Latch: Grasps breast easily, tongue down, lips flanged, rhythmical sucking.  Audible Swallowing: A few with stimulation  Type of Nipple: Everted at rest and after stimulation  Comfort (Breast/Nipple): Soft / non-tender     Hold (Positioning): Assistance needed to correctly position infant at breast and maintain latch. Intervention(s): Breastfeeding basics reviewed  LATCH Score: 8  Lactation Tools Discussed/Used Tools: Pump Pump Review: Setup, frequency, and cleaning Initiated by:: DW Date initiated:: 07/14/16   Consult Status Consult Status: Follow-up Date: 07/15/16 Follow-up type: In-patient    Pamelia HoitWeeks, Shifa Brisbon D 07/14/2016, 2:57 PM

## 2016-07-14 NOTE — Plan of Care (Signed)
Problem: Activity: Goal: Ability to tolerate increased activity will improve Outcome: Completed/Met Date Met: 07/14/16 Pt able to ambulate independently in the room.  Encouraged ambulation in the hallway.   Problem: Urinary Elimination: Goal: Ability to reestablish a normal urinary elimination pattern will improve Outcome: Completed/Met Date Met: 07/14/16 Pt able to void without difficulty.

## 2016-07-14 NOTE — Progress Notes (Signed)
Laura OtoFran Dishman CNM notified of pt's VE, orders received to admit pt

## 2016-07-15 LAB — CBC
HCT: 26.4 % — ABNORMAL LOW (ref 36.0–46.0)
HEMOGLOBIN: 9.5 g/dL — AB (ref 12.0–15.0)
MCH: 31.4 pg (ref 26.0–34.0)
MCHC: 36 g/dL (ref 30.0–36.0)
MCV: 87.1 fL (ref 78.0–100.0)
Platelets: 215 10*3/uL (ref 150–400)
RBC: 3.03 MIL/uL — AB (ref 3.87–5.11)
RDW: 14.2 % (ref 11.5–15.5)
WBC: 14.4 10*3/uL — ABNORMAL HIGH (ref 4.0–10.5)

## 2016-07-15 NOTE — Clinical Social Work Maternal (Addendum)
  CLINICAL SOCIAL WORK MATERNAL/CHILD NOTE  Patient Details  Name: Laura Cuevas MRN: [id number] Date of Birth: 1975-08-24  Date:  07/15/2016  Clinical Social Worker Initiating Note:  Ferdinand Lango Mar Zettler, MSW, LCSW-A  Date/ Time Initiated:  07/15/16/1321     Child's Name:  Melbourne Abts   Legal Guardian:  Other (Comment) (Not established by court system; MOB nad FOB Candie Chroman) parent collectivel yin primary household composition )   Need for Interpreter:  None   Date of Referral:  07/14/16     Reason for Referral:  Late or No Prenatal Care    Referral Source:  RN   Address:  [address] [place], [place] [address]   Phone number:  [phone]   Household Members:  Self, Spouse   Natural Supports (not living in the home):  Immediate Family, Friends, Extended Family   Professional Supports: None   Employment: Unemployed   Type of Work: unemployed    Education:      Pensions consultant:  [employer]   Other Resources:  Other (Comment) (None)   Cultural/Religious Considerations Which May Impact Care:  None reported.   Strengths:  Home prepared for child , Ability to meet basic needs    Risk Factors/Current Problems:  None   Cognitive State:  Alert , Able to Concentrate , Insightful , Goal Oriented    Mood/Affect:  Calm , Comfortable , Interested    CSW Assessment: CSW met with MOB at bedside to complete assessment for consult regarding [facility] @ 33 weeks. At the time of this writer's arrival, MOB was accompanied by her husband, Jaleea Alesi which whom she gave permission to continue assessment in front of. This Probation officer explained role and reasoning for visit. At this time, MOB notes she and her husband were on a long vacation in [place] for 6 months at which time she received [facility] but there was a gap between visits due to traveling. This wrier informed MOB that it is important she keep up with all follow-up appointments for she and baby moving forward regardless of  upcoming/future travel. MOB verbalized understanding. MOB notes she plans to enroll baby in [place]. CSW informed her that because she has medicaid, baby is automatically enrolled and covered for 45 days; however, she will need to go to to the medicaid office and official enroll him prior to day 45 so that a medicaid card can be mailed for baby. MOB verbalized understanding. This writer made MOB and FOB aware of the two drug screens taken of baby due to [facility]. Results for UDS are (-) negative but cord blood test results are still pending. This Probation officer informed MOB of the hospital policy and procedure for positive drug screen results and mandatory reporting. MOB denies using substance ever before pregnancy and after thus no concerns.   This Probation officer assessed for any psycho-social needs/ concerns at this time. MOB and FOB verbalize they have no further questions or concerns. MOB denies any hx of mental health dx and/or abuse/neglect. This Probation officer discussed PPD and safe sleeping/SIDS wit MOB and FOB. Both verbalize understanding. At this time, CSW has no barriers to discharge thus case closed to this CSW.   CSW Plan/Description:  No Further Intervention Required/No Barriers to Discharge, Other (Comment), Patient/Family Education  (CSW will continue to follow pending cord blood test results )    Ferdinand Lango Richards Pherigo, MSW, [facility]  Office: [phone]

## 2016-07-15 NOTE — Progress Notes (Signed)
Post Partum Day #1 Subjective: no complaints, up ad lib and tolerating PO; breastfeeding going well; undecided re contraception; denies dizziness w/ amb  Objective: Blood pressure (!) 107/58, pulse 67, temperature 98.4 F (36.9 C), temperature source Oral, resp. rate 18, height 4\' 11"  (1.499 m), weight 61.7 kg (136 lb), last menstrual period 11/01/2015, SpO2 100 %, unknown if currently breastfeeding.  Physical Exam:  General: alert, cooperative and no distress Lochia: appropriate Uterine Fundus: firm DVT Evaluation: No evidence of DVT seen on physical exam.   Recent Labs  07/14/16 1552 07/15/16 0538  HGB 10.8* 9.5*  HCT 30.7* 26.4*    Assessment/Plan: Plan for discharge tomorrow  Continue on FeSO4   LOS: 1 day   Cam HaiSHAW, Bernadene Garside CNM 07/15/2016, 9:44 AM

## 2016-07-16 NOTE — Discharge Summary (Signed)
OB Discharge Summary     Patient Name: Laura Cuevas DOB: 1975/12/04 MRN: [id number]  Date of admission: 07/14/2016 Delivering MD: Jacklyn Shell   Date of discharge: 07/16/2016  Admitting diagnosis: 36WKS,LABOR Intrauterine pregnancy: [redacted]w[redacted]d     Secondary diagnosis:  Active Problems:   Labor and delivery indication for care or intervention  Additional problems: Postpartum Hemorrhage     Discharge diagnosis: Preterm Pregnancy Delivered                                                                                                Post partum procedures:pitocin; cytotec  Augmentation: None  Complications: None  Hospital course:  Onset of Labor With Vaginal Delivery     41 y.o. yo G1P0101 at [redacted]w[redacted]d was admitted in Active Labor on 07/14/2016. Patient had an uncomplicated labor course as follows:  Membrane Rupture Time/Date: 8:06 AM ,07/14/2016   Intrapartum Procedures: Episiotomy: None [1]                                         Lacerations:  2nd degree [3];Perineal [11]  Patient had a delivery of a Viable infant. 07/14/2016  Information for the patient's newborn:  Myleen, Bra[place]ford [[id number]]  Delivery Method: Vag-Spont    Pateint had an uncomplicated postpartum course.  She is ambulating, tolerating a regular diet, passing flatus, and urinating well. Patient is discharged home in stable condition on 07/16/16.   Physical exam  Vitals:   07/14/16 1800 07/15/16 0614 07/15/16 1751 07/16/16 0640  BP: 113/62 (!) 107/58 101/64 118/69  Pulse: 78 67 83 87  Resp: 18 18 16 18   Temp: 99.2 F (37.3 C) 98.4 F (36.9 C) 98.3 F (36.8 C) 98.4 F (36.9 C)  TempSrc: Oral Oral Oral Oral  SpO2:      Weight:      Height:       General: alert, cooperative and appears stated age 64: appropriate Uterine Fundus: firm Incision: n/a DVT Evaluation: No evidence of DVT seen on physical exam. Negative Homan's sign. Labs: Lab Results  Component Value Date   WBC 14.4 (H)  07/15/2016   HGB 9.5 (L) 07/15/2016   HCT 26.4 (L) 07/15/2016   MCV 87.1 07/15/2016   PLT 215 07/15/2016   CMP 05/23/2016  Glucose 67    Discharge instruction: per After Visit Summary and "Baby and Me Booklet".  After visit meds:  Allergies as of 07/16/2016   No Known Allergies     Medication List    TAKE these medications   CONCEPT OB 130-92.4-1 MG Caps Take 1 tablet by mouth daily.   metroNIDAZOLE 500 MG tablet Commonly known as:  FLAGYL Take 1 tablet (500 mg total) by mouth 2 (two) times daily.   terconazole 0.4 % vaginal cream Commonly known as:  TERAZOL 7 Place 1 applicator vaginally at bedtime.       Diet: routine diet  Activity: Advance as tolerated. Pelvic rest for 6 weeks.   Outpatient follow up:6 weeks Follow up Appt:No  future appointments. Follow up Visit:No Follow-up on file.  Postpartum contraception: Undecided  Newborn Data: Live born female  Birth Weight: 6 lb 3.7 oz (2825 g) APGAR: 9, 9  Baby Feeding: Breast Disposition:home with mother   07/16/2016 Rochele PagesWalidah Karim, CNM

## 2016-07-16 NOTE — Discharge Instructions (Signed)

## 2016-07-16 NOTE — Lactation Note (Signed)
This note was copied from a baby's chart. Lactation Consultation Note:  Infant is 48 hours and is 8% weight loss. Mother is supplementing with formula. Mother started supplementing at 7am, infant has had 15ml 2 times.  Advised mother to increase amt she is giving. Reviewed LPI sheet with supplement parameters.   Assist mother with latching infant. Infant has a shallow latch. Adjust infants jaw for wider gape. Infant still shallow. Mother was fit with a nipple shield for better depth. Infant on and off for 10 mins. Small amt of colostrum on mothers nipple. Infant back to breast without the nipple shield. Observed better latch with a few swallows for 10 mins.   Advised mother to increase formula amt. Infant took 22 ml with bottle.  Advised mother to rouse infant with skin to skin and massage. Encouraged to feed with all feeding cues and at least 8-12 times in 24 hours. Mother to continue to supplement infant with formula until milk comes in and then supplement with her breastmilk.   Mother to post pump for 15 mins. Mother was given a hand pump and advised to use when she goes home. Mother states that they plan to buy an electric pump today or tomorrow. Mother is not active with WIC.  Patient Name: Laura Cristela FeltHelen Crute ZOXWR'UToday's Date: 07/16/2016 Reason for consult: Follow-up assessment   Maternal Data    Feeding Feeding Type: Breast Fed Nipple Type: Slow - flow Length of feed: 0 min  LATCH Score/Interventions Latch: Grasps breast easily, tongue down, lips flanged, rhythmical sucking.  Audible Swallowing: A few with stimulation Intervention(s): Skin to skin;Hand expression  Type of Nipple: Everted at rest and after stimulation  Comfort (Breast/Nipple): Soft / non-tender     Hold (Positioning): Assistance needed to correctly position infant at breast and maintain latch. Intervention(s): Support Pillows;Position options  LATCH Score: 8  Lactation Tools Discussed/Used     Consult  Status      Michel BickersKendrick, Marino Rogerson McCoy 07/16/2016, 9:46 AM

## 2016-07-17 ENCOUNTER — Ambulatory Visit: Payer: Self-pay

## 2016-07-17 NOTE — Lactation Note (Signed)
This note was copied from a baby's chart. Lactation Consultation Note  Patient Name: Laura Cuevas RWCHJ'S Date: 07/17/2016 Reason for consult: Follow-up assessment  Mom's milk has come to volume. She is not engorged, but she is quite full.   Infant was observed at the breast. He initially tried to latch without the NS, but the latch was shallow. Using the nipple shield (size 20), he latched w/relative ease. Swallows were noted by cervical auscultation. Initially there were intermittent swallows (but not as many as one would expect since his mother's breasts were full). A few minutes later, no additional swallows were noted. Infant took a bottle w/ease. Paced bottle-feeding taught. He had excellent suction with a bottle. He seems to have a slightly higher palate & his tongue shape when elevated suggests some possible posterior restriction.  Mom was assisted with using the DEBP on the initiation setting. She pumped 109m & she reported feeling softer after having pumped. Hand expression was taught to Mom. Mom shown how to assemble & use hand pump that was included in pump kit. Parents have access to a DEBP. Mom knows to pump q3hrs. Extra nipple shield provided. Alimentum OK to use per Dad.   Parents know to feed infant q3hrs. Mom understands that once he shows he is no longer transferring milk at the breast, to remove him & give him a bottle. Parents know how to clean nipple shield.   Outpatient LC appt made for 07-31-16, when infant will be 39 weeks CGA. Parents know they are welcome to call with questions, or if they desire to be seen as an outpatient sooner.   RMatthias HughsHBaylor St [facility]1/29/2018, 1:23 PM

## 2016-07-17 NOTE — Lactation Note (Signed)
This note was copied from a baby's chart. Lactation Consultation Note  Patient Name: Boy Cristela FeltHelen Mezo UJWJX'BToday's Date: 07/17/2016 Reason for consult: Follow-up assessment  Mother was resting when I entered room. Father updated me on infant's feeding chart. Father is [place]. I made him aware that Alimentum is not kosher. He stated that he is not "strict" with keeping kosher, but asked me some questions about Alimentum compared to regular formula. I answered his questions & told him that we could discuss it more once his wife was ready for me to return (I also need to assess how well breastfeeding is going, whether she is pumping or not, & if her feeding intention is to exclusively breastfeed once her milk has come to volume.)  Parents have my # to call when ready for consult.  Lurline HareRichey, Anaysia Germer [facility] 07/17/2016, 9:30 AM

## 2016-07-19 ENCOUNTER — Encounter: Payer: Medicaid Other | Admitting: Student

## 2016-07-26 ENCOUNTER — Encounter: Payer: Medicaid Other | Admitting: Advanced Practice Midwife

## 2016-07-31 ENCOUNTER — Ambulatory Visit (HOSPITAL_COMMUNITY)
Admission: RE | Admit: 2016-07-31 | Discharge: 2016-07-31 | Disposition: A | Discharge status: Home / Self Care | Payer: Medicaid Other | Source: Ambulatory Visit | Attending: Advanced Practice Midwife | Admitting: Advanced Practice Midwife

## 2016-07-31 NOTE — Lactation Note (Signed)
Lactation Consult  Mother's reason for visit:  LPTI Visit Type:  Outpatient Appointment Notes:  LPTI GA - 36.4, BW - 6 lbs, 3.7 oz Consult:  Follow-Up Lactation Consultant:  Lendon KaVann, Teresea Donley Walker  Baby's Name:  Laura Cuevas Date of Birth:  07/14/2015 Pediatrician:  Myrene BuddyJenny Riddle, FNP Gender:  female Gestational Age: 3839w4d (At Birth) Birth Weight:  6 lb 3.7 oz (2825 g) Weight at Discharge:  Weight: 5 lb 12.4 oz (2620 g)             Date of Discharge:  07/17/2016      [facility]   07/15/16 0000 07/15/16 2336 07/17/16 0009  Weight: 6 lb 0.8 oz (2745 g) 5 lb 11.4 oz (2590 g) 5 lb 12.4 oz (2620 g)  [facility]  [facility], 2 [facility] on [facility]16 (per parents)  Weight today:  6 lbs, 13 oz   Mother's Name: Laura Cuevas Type of delivery:  Vaginal Apgars 9 & 9 Breastfeeding Experience:  P1 Maternal Medical Conditions:  none Maternal Medications:  None, PNV  ________________________________________________________________________  Breastfeeding History (Post Discharge)  Frequency of breastfeeding:  Every 2-3 hrs.    Duration of feeding:  30 min breast on one side only then supplementation with either formula or expressed breast milk [facility] 6 lbs, 2 oz (per parents); today's initial weight - 6 lbs, 13 oz.  Proper amount of weight gain of 13 oz in 11 days.  Supplementation  Formula:  Volume 60 ml Frequency:  After each feeding about 5 times per day, other feedings EBM after BF Total volume per day:  ~300 ml       Brand: Similac Advanced - Ready to Feed  Breastmilk:  Volume 60 ml Frequency:  About 3-4 times per day after feeding Total volume per day:  180-240 ml  Method:  Bottle - Medela nipples and bottles  Infant Intake and Output Assessment  Voids:  6-7 in 24 hrs.  Color:  Clear yellow Stools:  5-6 in 24 hrs.  Color:  Yellow and  seedy  ________________________________________________________________________  Maternal Breast Assessment  Breast:  Soft Nipple:  Erect Pain level:  3 or greater Pain interventions:  Nipple shield #20 No scabbing, abrasions, or cracking noted  _______________________________________________________________________ Feeding Assessment/Evaluation  Initial feeding assessment: Right breast cross-cradle  Infant's oral assessment:  Variance - noted cobblestoning on lower lip and center upper lip suck blister.  Upper lip frenulum wide and thick extending almost to tip of gum line.  Infant can stick his tongue out to his lips.  When sucking on finger noted slightly high palate, some loss of contact with finger when attempting to suck with wave-like motion (but not complete loss of contact with finger); minor up & down motion noted with tongue.  Did not discuss tongue function with parents as the issue addressed today was more with mom's positioning of baby at breast for optimal milk transfer.  Encouraged mom to follow-up with support group for weight checks and checking milk transfer with feedings and encouraged to schedule additional LC OP appointments as needed (offered to schedule but parents stated they would rather call later).    Mom states she has been feeding every 2-3 hours for 30 min on one breast then offering formula or EBM supplementation 60 ml after breastfeeding.  After supplementing mom has been pumping with Medela DEBP (day and night); pumping only one side after  feeding.  Infant has been feeding ~7-8 times per day.   Positioning:  Cross cradle Right breast   Mom donned #20 nipple shield.  Mom has been using nipple shield d/t pain with latching.  Taught mom how to hand express to start flow of milk prior to putting on nipple shield.  When mom was latching infant at beginning of consult, mom was bringing breast to baby laying on pillow in her lap.  Baby's head was turned toward  breast with belly facing upward toward ceiling.  Baby was chewing/sucking his way onto breast.  Mom was not supporting breast and stated the latch hurt.  LC asked mom to take infant off breast and showed mom how to hold baby using cross-cradle hold, support breast with sandwiching in direction of infant' mouth, how to tuck chin-in first then roll head over nipple with latching, and how to un-tuck bottom lip using bottom finger for flanging lips outward.  Taught dad how to assist using teacup hold and how to assist with flanging bottom lip if mom needs assistance.  Mom stated the latch felt better and said there was "no pain" with the feeding.  Infant sucked with a consistent rhythm and frequent swallows noted.  Infant fed for 20 min and then came off. LS-9 after [facility] assisted with correct positioning.  Lots milk noted in nipple shield when infant came off.  Mom stated this is the first time she has seen milk in shield when he came off breast.  Weighed and noted 26 ml milk transfer at breast.  Infant still showing cues to feed (rooting), so mom re-latched to second breast with LC encouraging to re-latch to second breast.    LATCH documentation:  Latch:  2 = Grasps breast easily, tongue down, lips flanged, rhythmical sucking.  Audible swallowing:  2 = Spontaneous and intermittent  Type of nipple:  2 = Everted at rest and after stimulation  Comfort (Breast/Nipple):  2 = Soft / non-tender  Hold (Positioning):  1 = Assistance needed to correctly position infant at breast and maintain latch  LATCH score:  9 with assistance in correcting positioning  Attached assessment:  Deep with correct positioning  Lips flanged:  Yes.    Lips untucked:  No. LC had to teach mom and dad how to untuck bottom lip.    Suck assessment:  Displays both but more nutritive than non-nutritive  Tools:  Nipple shield 20 mm #20 Instructed on use and cleaning of tool:  Yes.    Pre-feed weight:  3090 g  (6 lb. 13 oz.) Post-feed  weight:  3116 g (6 lb. 13.9 oz.) Amount transferred:  26 ml Amount supplemented:  0 ml  Additional Feeding Assessment - Left Breast Cross-Cradle  Mom latched infant independently on second breast (left) with only verbal cues from [facility].  Mom un-tucked bottom lip and LC assured infant had a flanged lip.  Infant fed more slowly than on first side, with some occasional reminders to suck; fed for 20 minutes before coming off.  Only 12 ml transferred.  Infant was satisfied but awake.  Infant voided, FOB changed diaper after weighing and dressed infant.  Infant began to cry so parents gave 50 ml EBM from bottle.    Positioning:  Cross cradle Left breast  LATCH documentation:  Latch:  2 = Grasps breast easily, tongue down, lips flanged, rhythmical sucking.  Audible swallowing:  2 = Spontaneous and intermittent at beginning of feeding; slowed down after start of feeding.  Type of nipple:  2 = Everted at rest and after stimulation  Comfort (Breast/Nipple):  2 = Soft / non-tender  Hold (Positioning):  2 = No assistance needed to correctly position infant at breast  LATCH score:  10  Attached assessment:  Deep  Lips flanged:  Yes.    Lips untucked:  Yes.    Suck assessment:  Nutritive  Tools:  Nipple shield 20 mm Instructed on use and cleaning of tool:  Yes.    Pre-feed weight:  3116 g  (6 lb. 13.9 oz.) Post-feed weight:  3128 g (6 lb. 14.3 oz.) Amount transferred:  12 ml Amount supplemented:  50 ml EBM    Total amount Breast Milk transferred at breast:  38 ml Total supplement given (EBM):  50 ml Total Feeding (Breast and EBM via bottle) - 88 ml  Follow-up with Peds (per parents) on Aug 16, 2016 Follow-up with weight checks using hospital support group independently Follow-up with OP LC appointment - parents to call to schedule appointment as needed.   Plan of Care (given to parents written) 1.  Breastfeed baby on both sides of breast.  Allow infant to feed on 1st side until he comes  off.  Change his diaper, wake him up and offer second side until he is finished.    Practice Tummy Time (shown to parents during diaper change) with diaper changes.  Reminders for latching:  Tuck chin in first then roll head over nipple.  Make sure infant's body is facing breast with latching.   2.  If he feeds from both breast well and softens both sides, then supplement with 40-50 ml of Formula or EBM.  Watch cues to end bottle feeding or if he needs more supplementation.  (FOB gave example of how he knew infant was still hungry after a morning feeding when he was supplementing.  Reviewed feeding cues and cues of satiety.)    (used 150-200 ml/Kg/day guideline to calculate amount of supplementation needed after breastfeeding if infant feeds from both breast and transfers amount he transferred at today's consult.  LC expects infant's milk transfer amount   to continue to increase with proper practice and time.  Encouraged appropriate weight check follow-ups at support group and Peds office.  [facility] brochure given with support group times.) 3.  Pump both breast with DEBP.  Do good massaging while pumping.  Use this milk as supplementation for next feeding after breastfeeding.   4.  Try feeding without nipple shield.  (FOB stated only using shield d/t previous pain with latching.  Discussed how to wean off shield).  FOB stated infant has easily latched without shield in the past.

## 2016-08-02 ENCOUNTER — Encounter: Payer: Medicaid Other | Admitting: Advanced Practice Midwife

## 2016-08-09 ENCOUNTER — Encounter: Payer: Medicaid Other | Admitting: Advanced Practice Midwife

## 2016-08-21 ENCOUNTER — Ambulatory Visit: Payer: Medicaid Other | Admitting: Advanced Practice Midwife

## 2016-08-23 ENCOUNTER — Ambulatory Visit (INDEPENDENT_AMBULATORY_CARE_PROVIDER_SITE_OTHER): Payer: Medicaid Other | Admitting: Advanced Practice Midwife

## 2016-08-23 ENCOUNTER — Encounter: Payer: Self-pay | Admitting: Advanced Practice Midwife

## 2016-08-23 NOTE — Progress Notes (Signed)
Subjective:     Laura Cuevas is a 41 y.o. female who presents for a postpartum visit. She is 5 weeks postpartum following a spontaneous vaginal delivery. I have fully reviewed the prenatal and intrapartum course. The delivery was at 36 gestational weeks. Outcome: spontaneous vaginal delivery. Anesthesia: local. Postpartum course has been uneventful. Baby's course has been uneventful. Baby is feeding by both breast and bottle - Similac Advance. Bleeding no bleeding. Bowel function is normal. Bladder function is normal. Patient is not sexually active. Contraception method is undecided. Postpartum depression screening: negative.  The following portions of the patient's history were reviewed and updated as appropriate: allergies, current medications, past family history, past medical history, past social history, past surgical history and problem list.  Review of Systems Pertinent items are noted in HPI.   Objective:    There were no vitals taken for this visit.  General:  alert, cooperative and no distress   Breasts:  inspection negative, no nipple discharge or bleeding, no masses or nodularity palpable  Lungs: clear to auscultation bilaterally  Heart:  regular rate and rhythm, S1, S2 normal, no murmur, click, rub or gallop  Abdomen: soft, non-tender; bowel sounds normal; no masses,  no organomegaly   Vulva:  not evaluated  Vagina: not evaluated  Cervix:  n/a  Corpus: not examined  Adnexa:  not evaluated  Rectal Exam: Not performed.        Assessment:     Normal postpartum exam. Pap smear not done at today's visit.   Plan:    1. Contraception: undecided 2. Long discussion of contraceptive options. Has used Depo before, may want to use it again. Reviewed all options. 3. Follow up in: several weeks if she decides on contraception or as needed.

## 2016-08-23 NOTE — Patient Instructions (Signed)

## 2017-10-23 ENCOUNTER — Encounter: Payer: Self-pay | Admitting: Internal Medicine

## 2017-10-23 NOTE — Progress Notes (Signed)
This encounter was created in error - please disregard.

## 2018-03-21 IMAGING — US US MFM OB DETAIL+14 WK
1 series · 14 of 28 positions shown · non-contrast
Comparison: none

[Series 1: us mfm ob detail+14 wk · 95 acquisitions, 14 frames shown]
[im 4/95]
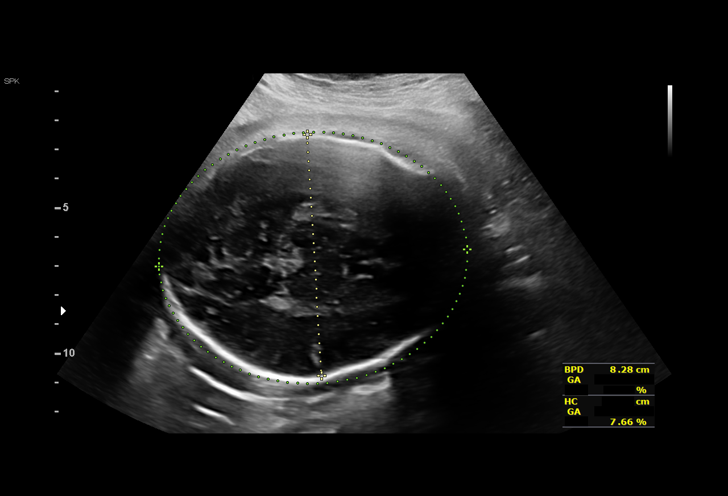
[im 11/95]
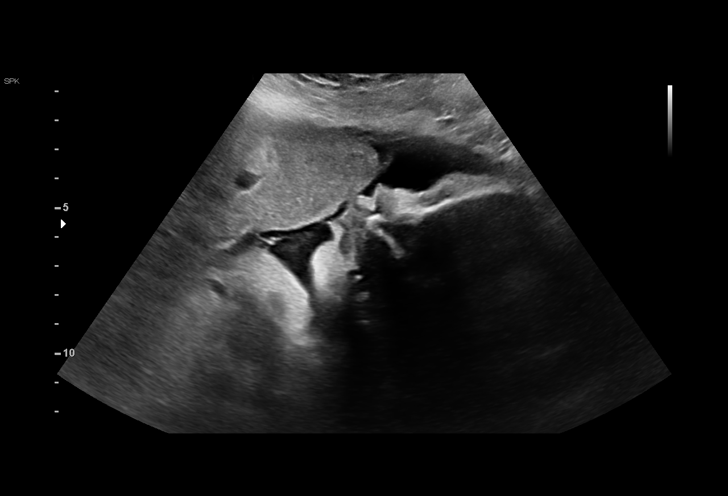
[im 18/95]
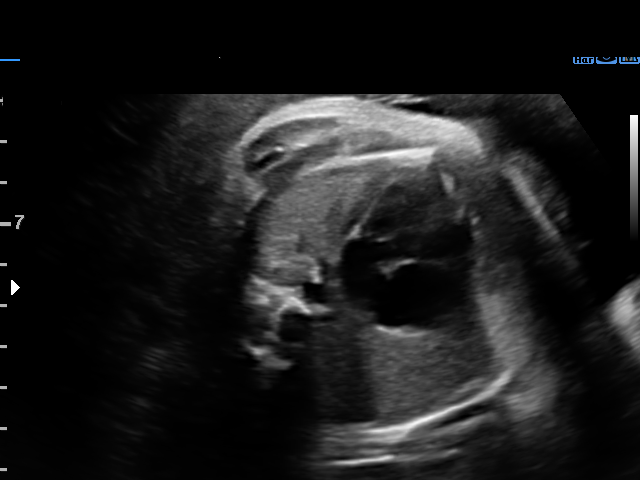
[im 25/95]
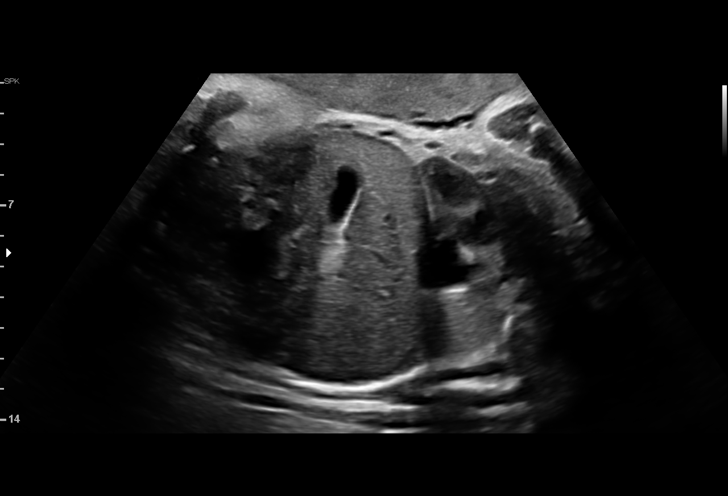
[im 32/95]
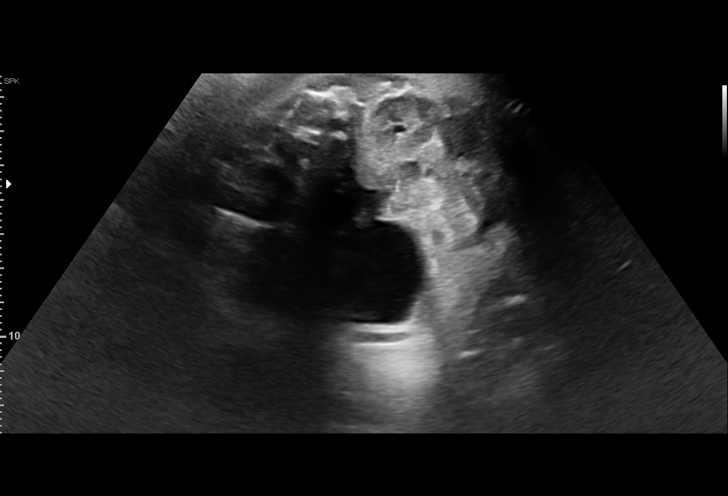
[im 39/95]
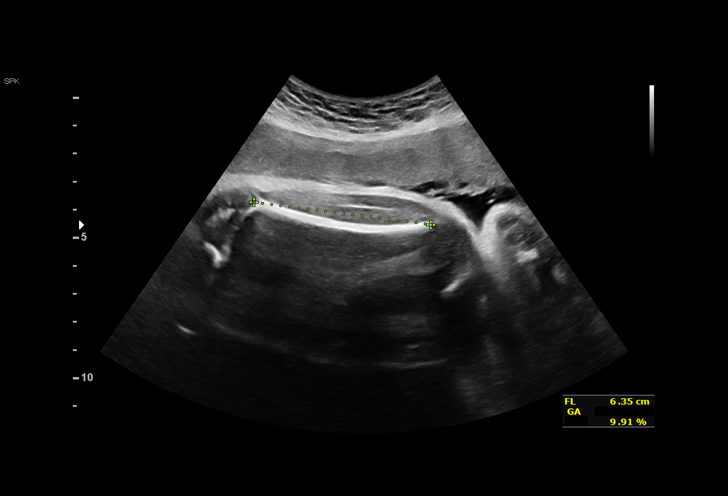
[im 46/95]
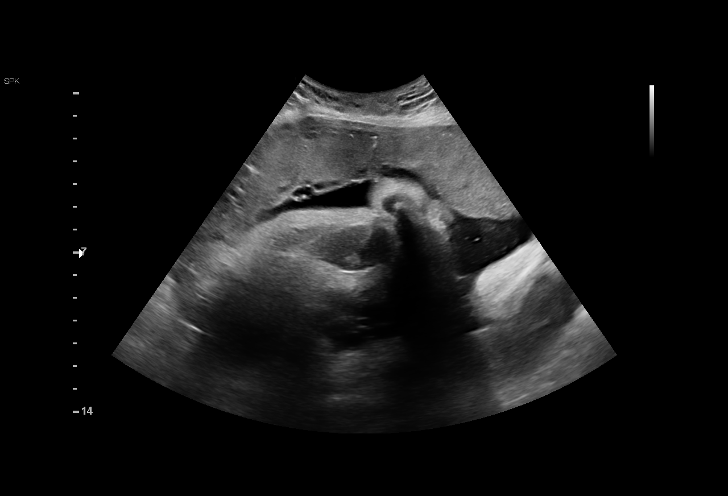
[im 53/95]
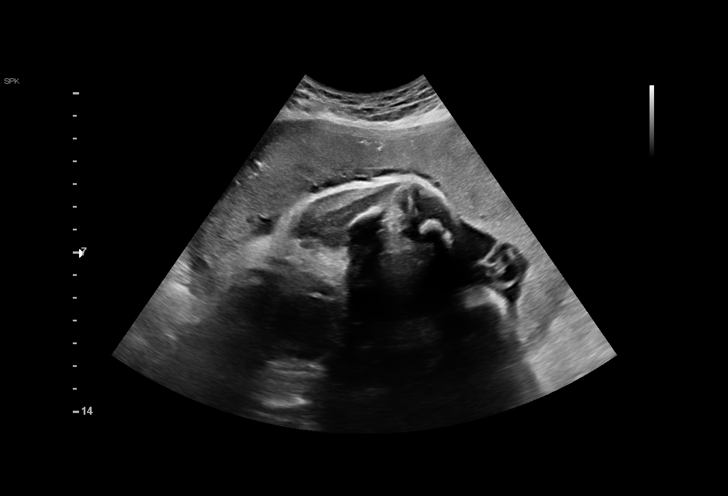
[im 60/95]
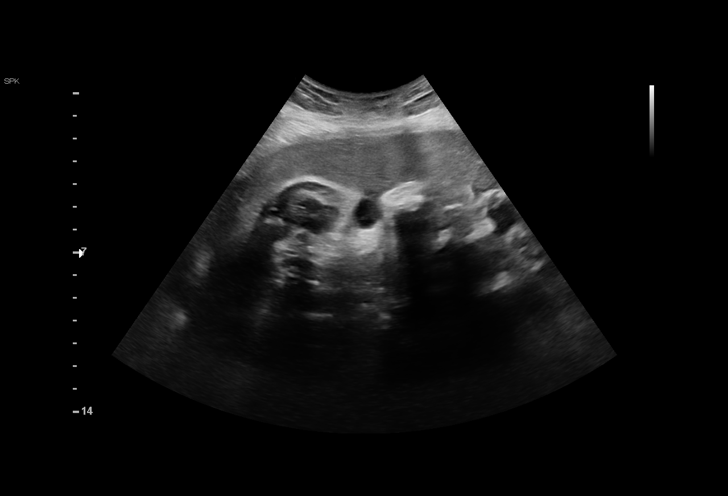
[im 67/95]
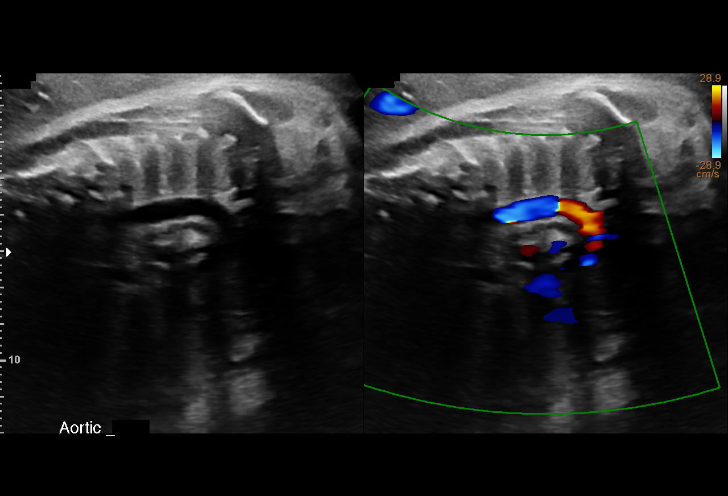
[im 74/95]
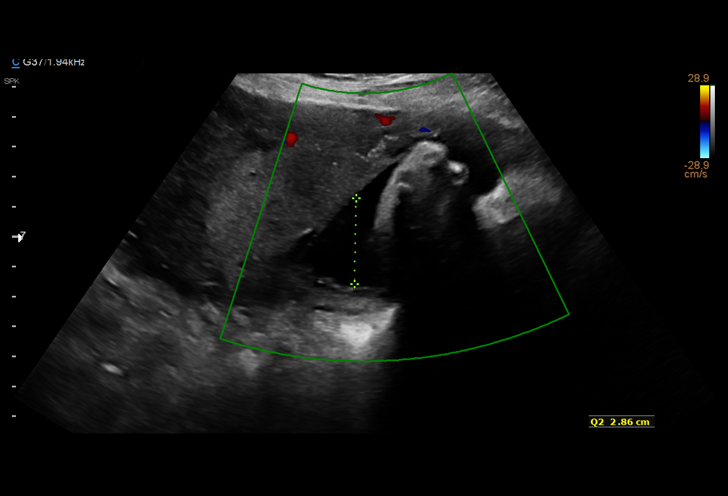
[im 81/95]
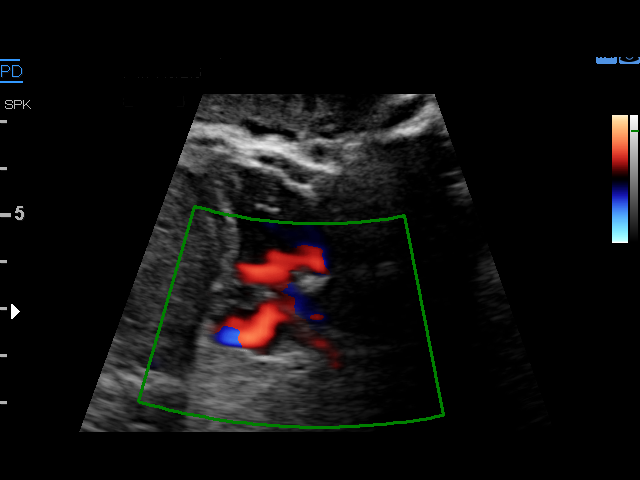
[im 88/95]
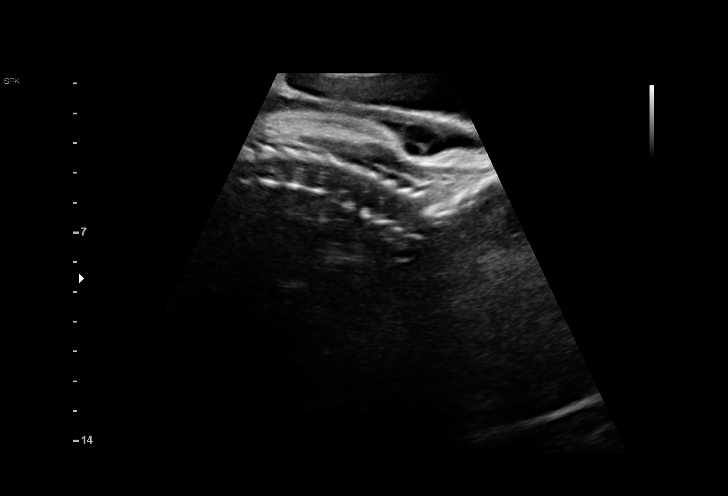
[im 95/95]
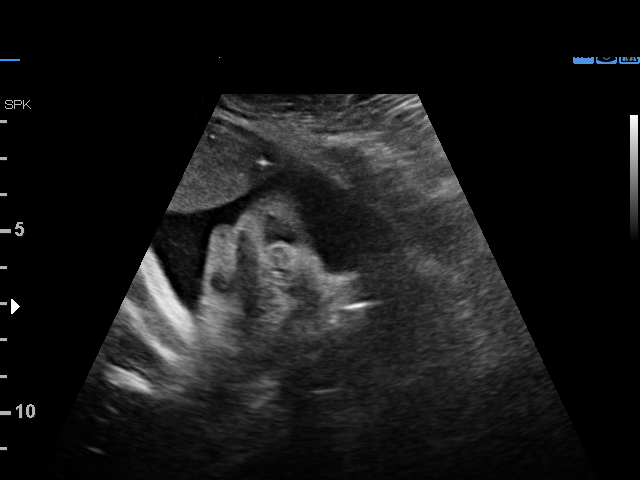

[14 of 28 positions shown; findings below may reference images not displayed]

1  KACNI RITU           [id number]      [id number]     [id number]
Indications

34 weeks gestation of pregnancy
Encounter for antenatal screening,
unspecified
Advanced maternal age multigravida 35+,
third trimester
Insufficient Prenatal Care
OB History

Blood Type:            Height:  4'11"  Weight (lb):  131      BMI:
Gravidity:    1         Term:   0        Prem:   0        SAB:   0
TOP:          0       Ectopic:  0        Living: 0
Fetal Evaluation

Num Of Fetuses:     1
Fetal Heart         153
Rate(bpm):
Cardiac Activity:   Observed
Presentation:       Cephalic
Placenta:           Anterior, above cervical os

Amniotic Fluid
AFI FV:      Subjectively within normal limits

AFI Sum(cm)     %Tile       Largest Pocket(cm)
13.61           46

RUQ(cm)       RLQ(cm)       LUQ(cm)        LLQ(cm)
6.01
Biometry

BPD:      83.7  mm     G. Age:  33w 5d         31  %    CI:        77.12   %   70 - 86
FL/HC:      21.5   %   19.4 -
HC:      301.8  mm     G. Age:  33w 3d          7  %    HC/AC:      0.98       0.96 -
AC:      309.2  mm     G. Age:  34w 6d         71  %    FL/BPD:     77.7   %   71 - 87
FL:         65  mm     G. Age:  33w 4d         22  %    FL/AC:      21.0   %   20 - 24
HUM:      56.3  mm     G. Age:  32w 5d         28  %

Est. FW:    3981  gm      5 lb 4 oz     59  %
Gestational Age

LMP:           34w 2d       Date:   11/01/15                 EDD:   08/07/16
U/S Today:     33w 6d                                        EDD:   08/10/16
Best:          34w 2d    Det. By:   LMP  (11/01/15)          EDD:   08/07/16
Anatomy

Cranium:               Appears normal         Aortic Arch:            Appears normal
Cavum:                 Appears normal         Ductal Arch:            Appears normal
Ventricles:            Appears normal         Diaphragm:              Appears normal
Choroid Plexus:        Not well visualized    Stomach:                Appears normal, left
sided
Cerebellum:            Not well visualized    Abdomen:                Appears normal
Posterior Fossa:       Not well visualized    Abdominal Wall:         Not well visualized
Nuchal Fold:           Not applicable (>20    Cord Vessels:           Appears normal (3
wks GA)                                        vessel cord)
Face:                  Appears normal         Kidneys:                Appear normal
(orbits and profile)
Lips:                  Appears normal         Bladder:                Appears normal
Thoracic:              Appears normal         Spine:                  Appears normal
Heart:                 Appears normal         Upper Extremities:      Visualized
(4CH, axis, and situs
RVOT:                  Appears normal         Lower Extremities:      Visualized
LVOT:                  Appears normal

Other:  Appears to be male  Extremities are visualized Difficult due to late
gestestational age
Cervix Uterus Adnexa

Uterus
No abnormality visualized.

Left Ovary
Not visualized.

Adnexa:       No abnormality visualized.
Impression

SIUP at 34+2 weeks
Cephalic presentation
Normal detailed fetal anatomy; limited views of posterior
fossa and CI
Normal amniotic fluid volume
Measurements consistent with LMP dating; EFW at the 59th
%tile
Recommendations

Follow-up as clinically indicated

## 2021-03-09 ENCOUNTER — Other Ambulatory Visit: Payer: Self-pay

## 2021-03-09 ENCOUNTER — Ambulatory Visit (INDEPENDENT_AMBULATORY_CARE_PROVIDER_SITE_OTHER): Payer: Medicaid Other

## 2021-03-09 DIAGNOSIS — Z3201 Encounter for pregnancy test, result positive: Secondary | ICD-10-CM

## 2021-03-09 LAB — POCT PREGNANCY, URINE: Preg Test, Ur: POSITIVE — AB

## 2021-03-09 NOTE — Progress Notes (Signed)
Pt dropped off urine today for UPT. UPT is positive.  Pt states had 2 + UPT at home.   Pt denies any vaginal bleeding, abd pain or cramps at this time. Pt advised to start taking PNV. Pt verbalized understanding and agreeable to plan of care.  New OB intake appt scheduled for 10/6@ 9:15am.  LMP: 01/14/21 EDC: 10/21/21 [redacted]w[redacted]d   Laura Mastrogiovanni, RN

## 2021-03-09 NOTE — Patient Instructions (Signed)

## 2021-03-11 NOTE — Progress Notes (Signed)
Patient was assessed and managed by nursing staff during this encounter. I have reviewed the chart and agree with the documentation and plan. I have also made any necessary editorial changes.  Siyon Linck, MD 03/11/2021 10:17 AM   

## 2021-03-23 ENCOUNTER — Telehealth (INDEPENDENT_AMBULATORY_CARE_PROVIDER_SITE_OTHER): Payer: Self-pay

## 2021-03-23 DIAGNOSIS — O09529 Supervision of elderly multigravida, unspecified trimester: Secondary | ICD-10-CM

## 2021-03-23 DIAGNOSIS — O0990 Supervision of high risk pregnancy, unspecified, unspecified trimester: Secondary | ICD-10-CM | POA: Insufficient documentation

## 2021-03-23 DIAGNOSIS — O219 Vomiting of pregnancy, unspecified: Secondary | ICD-10-CM

## 2021-03-23 DIAGNOSIS — Z3A00 Weeks of gestation of pregnancy not specified: Secondary | ICD-10-CM

## 2021-03-23 DIAGNOSIS — O09899 Supervision of other high risk pregnancies, unspecified trimester: Secondary | ICD-10-CM | POA: Insufficient documentation

## 2021-03-23 DIAGNOSIS — O09521 Supervision of elderly multigravida, first trimester: Secondary | ICD-10-CM | POA: Insufficient documentation

## 2021-03-23 MED ORDER — PRENATAL PLUS 27-1 MG PO TABS: 1.0000 | ORAL_TABLET | Freq: Every day | ORAL | 0 refills | Status: AC

## 2021-03-23 MED ORDER — PROMETHAZINE HCL 25 MG PO TABS: 25.0000 mg | ORAL_TABLET | Freq: Four times a day (QID) | ORAL | 0 refills | Status: DC | PRN

## 2021-03-23 MED ORDER — BLOOD PRESSURE KIT DEVI: 1.0000 | Freq: Once | 0 refills | Status: AC

## 2021-03-23 MED ORDER — BLOOD PRESSURE KIT DEVI: 1.0000 | Freq: Once | 0 refills | Status: DC

## 2021-03-23 NOTE — Patient Instructions (Signed)
AREA PEDIATRIC/FAMILY PRACTICE PHYSICIANS  [place] ([address]) [facility] Chambliss, MD; Eniola, MD; Hale, MD; Hensel, MD; McDiarmid, MD; McIntyer, MD; Neal, MD; Walden, MD [address] [place], [place] [address] [phone] Mon-Fri 8:30-12:30, 1:30-5:00 Providers come to see babies at [facility] Accepting Medicaid [facility] providers who accept newborns: Koirala, MD; Morrow, MD; Wolters, MD [address], [place], [place] [address] [phone] Mon-Fri 8:00-5:30 Babies seen by providers at [facility] Does NOT accept Medicaid Please call early in hospitalization for appointment (limited availability)  [facility] Mulberry, MD [address] [place], [place] [address] [phone] Mon, Tue, Thur, Fri 8:30-5:00, Wed 10:00-7:00 (closed 1-2pm) Babies seen by [facility] providers Accepting Medicaid Rubin - Pediatrician Rubin, MD [address], [place], [place] [address] [phone] Mon-Fri 8:30-5:00, Sat 8:30-12:00 Provider comes to see babies at [facility] Accepting Medicaid Must have been referred from current patients or contacted office prior to delivery [facility] ([facility]) Brown, MD; Chandler, MD; Ettefagh, MD; Grant, MD; Lester, MD; McCormick, MD; McQueen, MD; Prose, MD; Simha, MD; Stanley, MD; Stryffeler, NP; Tebben, NP [address], [place], [place] [address] [phone] Mon, Tue, Thur, Fri 8:30-5:30, Wed 9:30-5:30, Sat 8:30-12:30 Babies seen by [facility] providers Accepting Medicaid Only accepting infants of first-time parents or siblings of current patients Hospital discharge coordinator will make follow-up appointment Jack Amos [address], [place], [place]  [address] [phone]   Fax - [phone] [facility] [address], [place], [place]  [address] Phone - [phone]   Fax - [phone] Shilpa Gosrani [address], [place], [place]  [address] [phone]  [place] ([address]) [facility] Bates, MD; Brassfield, MD; Cooper, Cox, MD; MD; Davis, MD; Dovico, MD; Ettefaugh, MD; Little, MD; Lowe, MD; Keiffer, MD; Melvin, MD; Sumner, MD; Williams, MD [address], [place], [place] [address] [phone] Mon-Fri 8:30-5:00 (extended evenings Mon-Thur as needed), Sat-Sun 10:00-1:00 Providers come to see babies at [facility] Accepting Medicaid for families of first-time babies and families with all children in the household age 3 and under. Must register with office prior to making appointment (M-F only). [facility] Henson, NP; Knapp, MD; Lalonde, MD; Tysinger, PA [address] [place], [place] [address] [phone] Mon-Fri 8:00-5:00 Babies seen by providers at [facility] Does NOT accept Medicaid/Commercial Insurance Only [facility]  Artis, MD; Barnes, MD; Bratton, MD; Coccaro, MD; Lockett Gardner, MD; Kramer, MD; Marshall, MD; Netherton, MD; Poleto, MD; Skinner, MD [address] [place], [place] [address] [phone] Mon-Fri 8:30-5:30, Sat (Oct.-Mar.) 9:00-1:00 Babies seen by providers at [facility] Accepting Medicaid  [place] ([address]) [facility] Reid, MD; Warner, MD [address], [place], [place] [address] [phone] Mon-Fri 8:30-5:00, Sat 8:30-12:00 Providers come to see babies at [facility] Does NOT accept Medicaid [facility] Becker, PA; Hagler, MD; Scifres, PA; Sun, MD; Swayne, MD [address], [place], [place] [address] [phone] Mon-Fri 8:00-5:00 Babies seen by providers at [facility] Does NOT accept Medicaid Only accepting babies of parents who  are patients Please call early in hospitalization for appointment (limited availability) [facility] Clark, MD; Frye, MD; Kelleher, MD; Mack, NP; Miller, MD; O'Keller, MD; Patterson, NP; Pudlo, MD; Puzio, MD; Thomas, MD; Tucker, MD; Twiselton, MD [address], [place], [place] [address] °[phone] °Mon-Fri 8:00-5:00, Sat 9:00-12:00 °Providers come to see babies at [facility] °Does NOT accept Medicaid ° °[place] ([address]) °[facility] °Limited providers accepting new patients: Brake, NP; Wharton, PA °[address], [place], [place] [address] °[phone] °Mon-Fri 8:00-5:00 °Babies seen by providers at [facility] °Does NOT accept Medicaid °Only accepting babies of parents who are patients °Please call early in hospitalization for appointment (limited availability) °[facility] °Gay, MD; Quinlan, MD °[address] [place], [place] [address] °[phone] (press [phone] to schedule appointment) °Mon-Fri 8:00-5:00 °Providers come to see babies at [facility] °Does NOT accept Medicaid °[facility] °Mazer, MD °[address] [place], [place] [address] °[phone] °Mon-Fri 8:30-5:00 (lunch 12:30-1:00), extended hours by appointment only Wed 5:00-6:30 °Babies seen by [facility] providers °Accepting Medicaid °[facility] °Banks, MD; Jordan, MD; Koberlein, MD °[address], [place], [place] [address] °[phone] °Mon-Fri 8:00-5:00 °Babies seen by [facility] providers °Does NOT accept Medicaid °[facility] °Parker, MD; Hunter, MD; Wallace, DO °[address] [place], [place] [address] °[phone] °Mon-Fri 8:00-5:00 °Babies seen by [facility] providers °Does NOT accept Medicaid °[facility] °Brandon, PA; Brecken, PA; Christy, NP; Dees, MD; DeClaire, MD; DeWeese, MD; Hansen, NP; Mills, NP; Parrish, NP; Smoot, NP; Summer, MD; Vapne,  MD °[address] [place], [place] [address] °[phone] °Mon-Fri 8:30-5:00, Sat 10:00-1:00 °Providers come to see babies at [facility] °Does NOT accept Medicaid °Free prenatal information session Tuesdays at 4:45pm °[facility] °Bouska, MD; Gordon, PA; Jeffery, PA; Weber, PA °[address] [place] [address] °[phone] °Mon-Fri 7:30-5:30 °Babies seen by [facility] providers °[facility] °[address], [place], [place]  [address] °[phone]   Fax - [phone] ° °[place] ([address] & [address]) °[facility] °[place] °[address] [place], [place] [address] °[phone] °Mon-Thur 8:00-6:00 °Providers come to see babies at [facility] °Accepting Medicaid °[facility] °Anderson, NP; Badger, MD; Beal, PA; Spencer, PA °[address] [place], [place] [address] °[phone] °Mon-Thur 7:30-7:30, Fri 7:30-4:30 °Babies seen by [facility] providers °Accepting Medicaid °[facility] °Agbuya, MD; Klett, NP; Romgoolam, MD °[address], [place], [place] [address] °[phone] °Mon-Fri 8:30-5:00, Sat 8:30-12:00 °Providers come to see babies at [facility] °Accepting Medicaid °Must have “Meet & Greet” appointment at office prior to delivery °[facility] ([facility] °McCord, MD; Wallace, MD; Wood, MD °[address], [place], [place] [address] °[phone] °Mon-Wed 8:00-6:00, Thur-Fri 8:00-5:00, Sat 9:00-12:00 °Providers come to see babies at [facility] °Does NOT accept Medicaid °Only accepting siblings of current patients °[facility]  °[address], [place], [place]  [address] °[phone]   Fax - [phone] °[facility] °[address], [place],   [address] °[phone]   Fax - [phone] ° °

## 2021-03-23 NOTE — Progress Notes (Signed)
New OB Intake  I connected with  Laura Cuevas on 03/23/21 at  3:15 PM EDT by MyChart Video Visit and verified that I am speaking with the correct person using two identifiers. Nurse is located at [facility] and pt is located at home. Husband present for visit.  I discussed the limitations, risks, security and privacy concerns of performing an evaluation and management service by telephone and the availability of in person appointments. I also discussed with the patient that there may be a patient responsible charge related to this service. The patient expressed understanding and agreed to proceed.  I explained I am completing New OB Intake today. We discussed her EDD of 10/21/21 that is based on LMP of 01/14/21. Pt is G2/P0101. I reviewed her allergies, medications, Medical/Surgical/OB history, and appropriate screenings. I informed her of [facility] services. Based on history, this is a high risk pregnancy due to advanced maternal age.   Patient Active Problem List   Diagnosis Date Noted   Supervision of high risk pregnancy, antepartum 03/23/2021   Multigravida of advanced maternal age in first trimester 03/23/2021    Concerns addressed today Ongoing nausea; Phenergan sent to pharmacy per protocol.   Delivery Plans:  Plans to deliver at [facility].   MyChart/Babyscripts MyChart access verified. Babyscripts instructions given and order placed.  Blood Pressure Cuff  Blood pressure cuff ordered for patient to pick-up from [employer]. Explained after first prenatal appt pt will check weekly and document in Babyscripts.  Weight scale: Patient has weight scale.  Anatomy US Explained first scheduled Korea will be around 19 weeks. Anatomy US scheduled for 05/27/21 at 1430.   Labs Discussed Avelina Laine genetic screening with patient; pt desires this. Routine prenatal labs needed.  COVID Vaccine Patient has not had COVID vaccine.   Mother/ Baby Dyad Candidate?    No  Social Determinants of  Health Food Insecurity: Patient denies food insecurity. WIC Referral: Patient is interested in referral to [facility].  Transportation: Patient denies transportation needs. Childcare: Discussed no children allowed at ultrasound appointments. Offered childcare services; patient may need this service, will follow up with office.  First visit review I reviewed new OB appt with pt. I explained she will have a provider visit with physical exam and OB blood work. Encounter routed to appropriate provider. Explained that patient will be seen by pregnancy navigator following visit with provider.  Marjo Bicker, RN 03/23/2021  3:47 PM

## 2021-03-24 ENCOUNTER — Telehealth: Payer: Medicaid Other

## 2021-03-28 ENCOUNTER — Other Ambulatory Visit: Payer: Self-pay

## 2021-03-28 DIAGNOSIS — O0990 Supervision of high risk pregnancy, unspecified, unspecified trimester: Secondary | ICD-10-CM

## 2021-03-30 ENCOUNTER — Other Ambulatory Visit: Payer: Self-pay

## 2021-03-30 DIAGNOSIS — O0990 Supervision of high risk pregnancy, unspecified, unspecified trimester: Secondary | ICD-10-CM

## 2021-03-30 DIAGNOSIS — O219 Vomiting of pregnancy, unspecified: Secondary | ICD-10-CM

## 2021-03-30 NOTE — Progress Notes (Signed)
Attestation of Attending Supervision of clinical support staff: I agree with the care provided to this patient and was available for any consultation.  I have reviewed the RN's note and chart. I was available for consult and to see the patient if needed.   I have reviewed this intake.   Federico Flake, MD, MPH, ABFM Attending Physician Faculty Practice- Center for [facility]

## 2021-04-03 ENCOUNTER — Other Ambulatory Visit: Payer: Self-pay

## 2021-04-03 DIAGNOSIS — O0990 Supervision of high risk pregnancy, unspecified, unspecified trimester: Secondary | ICD-10-CM

## 2021-04-04 ENCOUNTER — Inpatient Hospital Stay (HOSPITAL_COMMUNITY)
Admission: AD | Admit: 2021-04-04 | Discharge: 2021-04-04 | Disposition: A | Discharge status: Home / Self Care | Payer: Medicaid Other | Attending: Family Medicine | Admitting: Family Medicine

## 2021-04-04 ENCOUNTER — Inpatient Hospital Stay (HOSPITAL_COMMUNITY): Payer: Medicaid Other

## 2021-04-04 ENCOUNTER — Other Ambulatory Visit: Payer: Self-pay

## 2021-04-04 ENCOUNTER — Encounter (HOSPITAL_COMMUNITY): Payer: Self-pay | Admitting: Family Medicine

## 2021-04-04 DIAGNOSIS — O09521 Supervision of elderly multigravida, first trimester: Secondary | ICD-10-CM | POA: Insufficient documentation

## 2021-04-04 DIAGNOSIS — Z3A11 11 weeks gestation of pregnancy: Secondary | ICD-10-CM | POA: Diagnosis not present

## 2021-04-04 DIAGNOSIS — Z20822 Contact with and (suspected) exposure to covid-19: Secondary | ICD-10-CM | POA: Diagnosis not present

## 2021-04-04 DIAGNOSIS — Z79899 Other long term (current) drug therapy: Secondary | ICD-10-CM | POA: Diagnosis not present

## 2021-04-04 DIAGNOSIS — O209 Hemorrhage in early pregnancy, unspecified: Secondary | ICD-10-CM

## 2021-04-04 DIAGNOSIS — O0990 Supervision of high risk pregnancy, unspecified, unspecified trimester: Secondary | ICD-10-CM

## 2021-04-04 DIAGNOSIS — O26851 Spotting complicating pregnancy, first trimester: Secondary | ICD-10-CM | POA: Insufficient documentation

## 2021-04-04 DIAGNOSIS — O034 Incomplete spontaneous abortion without complication: Secondary | ICD-10-CM

## 2021-04-04 DIAGNOSIS — O200 Threatened abortion: Secondary | ICD-10-CM

## 2021-04-04 DIAGNOSIS — O031 Delayed or excessive hemorrhage following incomplete spontaneous abortion: Secondary | ICD-10-CM | POA: Diagnosis not present

## 2021-04-04 LAB — URINALYSIS, ROUTINE W REFLEX MICROSCOPIC
Bilirubin Urine: NEGATIVE
Glucose, UA: NEGATIVE mg/dL
Ketones, ur: NEGATIVE mg/dL
Leukocytes,Ua: NEGATIVE
Nitrite: NEGATIVE
Protein, ur: NEGATIVE mg/dL
Specific Gravity, Urine: 1.003 — ABNORMAL LOW (ref 1.005–1.030)
pH: 7 (ref 5.0–8.0)

## 2021-04-04 LAB — CBC
HCT: 40.3 % (ref 36.0–46.0)
Hemoglobin: 13.3 g/dL (ref 12.0–15.0)
MCH: 29.9 pg (ref 26.0–34.0)
MCHC: 33 g/dL (ref 30.0–36.0)
MCV: 90.6 fL (ref 80.0–100.0)
Platelets: 306 10*3/uL (ref 150–400)
RBC: 4.45 MIL/uL (ref 3.87–5.11)
RDW: 12.4 % (ref 11.5–15.5)
WBC: 10.6 10*3/uL — ABNORMAL HIGH (ref 4.0–10.5)
nRBC: 0 % (ref 0.0–0.2)

## 2021-04-04 LAB — HIV ANTIBODY (ROUTINE TESTING W REFLEX): HIV Screen 4th Generation wRfx: NONREACTIVE

## 2021-04-04 LAB — WET PREP, GENITAL
Clue Cells Wet Prep HPF POC: NONE SEEN
Sperm: NONE SEEN
Trich, Wet Prep: NONE SEEN
Yeast Wet Prep HPF POC: NONE SEEN

## 2021-04-04 LAB — HCG, QUANTITATIVE, PREGNANCY: hCG, Beta Chain, Quant, S: 10442 m[IU]/mL — ABNORMAL HIGH (ref <5)

## 2021-04-04 NOTE — MAU Note (Addendum)
...  Laura Cuevas is a 45 y.o. at [redacted]w[redacted]d here in MAU reporting: She noticed dark red vaginal spotting at 1030 today after using the restroom. She states she went home and used the restroom again and noticed more spotting on her panty liner with one dime sized clot. Last IC 2-3 weeks ago. Denies any vaginal odors or vaginal itching and states her vaginal discharge has been clear. She states yesterday she noticed her discharge was "brownish" but only saw it once. Denies any pain.  Unable to doppler FHT.  Lab orders placed from triage: UA

## 2021-04-04 NOTE — Discharge Instructions (Signed)
Return to MAU: If you have heavier bleeding that soaks through more that 2 pads per hour for an hour or more If you bleed so much that you feel like you might pass out or you do pass out If you have significant abdominal pain that is not improved with Tylenol 1000 mg every 8 hours as needed for pain If you develop a fever > 100.5  

## 2021-04-04 NOTE — MAU Provider Note (Addendum)
History     CSN: [id number]  Arrival date and time: 04/04/21 1242   Event Date/Time   First Provider Initiated Contact with Patient 04/04/21 1459      Chief Complaint  Patient presents with   Vaginal Bleeding   Ms. Laura Cuevas is a 45 y.o. year old G99P0101 female at [redacted]w[redacted]d weeks gestation who presents to MAU reporting dark red vaginal spotting @ 1030 after using the BR. She has since noticed spotting on her pantiliner with a dine-sized clot. She also reports having brownish d/c yesterday. Last SI was 2-3 weeks ago. She denies odor, itching, or abnormal vaginal d/c. Her high-risk pregnancy history is significant for: AMA and PTB @ 36 weeks (2018). Her high risk pregnancy is managed by [facility]. Her spouse is present and contributing to the history taking.  OB History     Gravida  2   Para  1   Term  0   Preterm  1   AB  0   Living  1      SAB  0   IAB  0   Ectopic  0   Multiple  0   Live Births  1           Past Medical History:  Diagnosis Date   Medical history non-contributory     Past Surgical History:  Procedure Laterality Date   NO PAST SURGERIES      Family History  Problem Relation Age of Onset   Thyroid cancer Mother    Hypertension Father    Diabetes Father    Heart disease Father    Colon cancer Maternal Uncle    Diabetes Paternal Uncle    Hypertension Paternal Uncle    Hypertension Paternal Aunt    Diabetes Paternal Aunt     Social History   Tobacco Use   Smoking status: Never   Smokeless tobacco: Never  Vaping Use   Vaping Use: Never used  Substance Use Topics   Alcohol use: No   Drug use: No    Allergies: No Known Allergies  Medications Prior to Admission  Medication Sig Dispense Refill Last Dose   prenatal vitamin w/FE, FA (PRENATAL 1 + 1) 27-1 MG TABS tablet Take 1 tablet by mouth daily at 12 noon. 30 tablet 0 04/03/2021   Prenat w/o A Vit-FeFum-FePo-FA (CONCEPT OB) 130-92.4-1 MG CAPS Take 1 tablet by mouth daily. 30  capsule 12    promethazine (PHENERGAN) 25 MG tablet Take 1 tablet (25 mg total) by mouth every 6 (six) hours as needed for nausea or vomiting. 30 tablet 0     Review of Systems  Constitutional: Negative.   HENT: Negative.    Eyes: Negative.   Respiratory: Negative.    Cardiovascular: Negative.   Gastrointestinal: Negative.   Endocrine: Negative.   Genitourinary:  Positive for vaginal bleeding.  Musculoskeletal: Negative.   Skin: Negative.   Allergic/Immunologic: Negative.   Neurological: Negative.   Hematological: Negative.   Psychiatric/Behavioral: Negative.    Physical Exam   Blood pressure 110/82, pulse 82, temperature 98.3 F (36.8 C), temperature source Oral, resp. rate 17, height 4\' 11"  (1.499 m), weight 56.9 kg, last menstrual period 01/14/2021, SpO2 100 %, unknown if currently breastfeeding.  Physical Exam Vitals and nursing note reviewed. Exam conducted with a chaperone present.  Constitutional:      Appearance: Normal appearance. She is normal weight.  Cardiovascular:     Rate and Rhythm: Normal rate.  Pulmonary:     Effort:  Pulmonary effort is normal.  Abdominal:     General: Abdomen is flat.     Palpations: Abdomen is soft.  Genitourinary:    General: Normal vulva.     Comments: Pelvic exam: External genitalia normal, SE: vaginal walls pink and well rugated, cervix is smooth, pink, no lesions, small amt of pinkish-brown bloody vaginal d/c with very small solid pieces resemblant of blood clots-- WP, GC/CT done, cervix visually closed, Uterus is non-tender, no CMT, (+) friability, no adnexal tenderness. Musculoskeletal:        General: Normal range of motion.  Skin:    General: Skin is warm and dry.  Neurological:     Mental Status: She is alert and oriented to person, place, and time.  Psychiatric:        Mood and Affect: Mood normal.        Behavior: Behavior normal.        Thought Content: Thought content normal.        Judgment: Judgment normal.   FHTs  by doppler: unable to doppler FHT with doppler  MAU Course  Procedures  MDM CCUA UPT CBC ABO/Rh -- not drawn, known O Positive HCG Wet Prep GC/CT -- pending HIV -- pending OB < 14 wks [place] with TV  Results for orders placed or performed during the hospital encounter of 04/04/21 (from the past 24 hour(s))  Urinalysis, Routine w reflex microscopic Urine, Clean Catch     Status: Abnormal   Collection Time: 04/04/21  1:10 PM  Result Value Ref Range   Color, Urine STRAW (A) YELLOW   APPearance CLEAR CLEAR   Specific Gravity, Urine 1.003 (L) 1.005 - 1.030   pH 7.0 5.0 - 8.0   Glucose, UA NEGATIVE NEGATIVE mg/dL   Hgb urine dipstick LARGE (A) NEGATIVE   Bilirubin Urine NEGATIVE NEGATIVE   Ketones, ur NEGATIVE NEGATIVE mg/dL   Protein, ur NEGATIVE NEGATIVE mg/dL   Nitrite NEGATIVE NEGATIVE   Leukocytes,Ua NEGATIVE NEGATIVE   RBC / HPF 0-5 0 - 5 RBC/hpf   WBC, UA 0-5 0 - 5 WBC/hpf   Bacteria, UA RARE (A) NONE SEEN   Squamous Epithelial / LPF 0-5 0 - 5  Wet prep, genital     Status: Abnormal   Collection Time: 04/04/21  2:56 PM   Specimen: Vaginal  Result Value Ref Range   Yeast Wet Prep HPF POC NONE SEEN NONE SEEN   Trich, Wet Prep NONE SEEN NONE SEEN   Clue Cells Wet Prep HPF POC NONE SEEN NONE SEEN   WBC, Wet Prep HPF POC MODERATE (A) NONE SEEN   Sperm NONE SEEN   CBC     Status: Abnormal   Collection Time: 04/04/21  3:04 PM  Result Value Ref Range   WBC 10.6 (H) 4.0 - 10.5 K/uL   RBC 4.45 3.87 - 5.11 MIL/uL   Hemoglobin 13.3 12.0 - 15.0 g/dL   HCT 35.3 29.9 - 24.2 %   MCV 90.6 80.0 - 100.0 fL   MCH 29.9 26.0 - 34.0 pg   MCHC 33.0 30.0 - 36.0 g/dL   RDW 68.3 41.9 - 62.2 %   Platelets 306 150 - 400 K/uL   nRBC 0.0 0.0 - 0.2 %  hCG, quantitative, pregnancy     Status: Abnormal   Collection Time: 04/04/21  3:04 PM  Result Value Ref Range   hCG, Beta Chain, Quant, S 10,442 (H) <5 mIU/mL  HIV Antibody (routine testing w rflx)     Status: None  Collection Time: 04/04/21   3:04 PM  Result Value Ref Range   HIV Screen 4th Generation wRfx Non Reactive Non Reactive    US OB LESS THAN 14 WEEKS WITH OB TRANSVAGINAL  Result Date: 04/04/2021 CLINICAL DATA:  Vaginal bleeding for 1 day EXAM: OBSTETRIC <14 WK [place] AND TRANSVAGINAL OB US TECHNIQUE: Both transabdominal and transvaginal ultrasound examinations were performed for complete evaluation of the gestation as well as the maternal uterus, adnexal regions, and pelvic cul-de-sac. Transvaginal technique was performed to assess early pregnancy. COMPARISON:  None. FINDINGS: Intrauterine gestational sac: Single intrauterine gestational sac Yolk sac:  Not visualized Embryo:  Not visualized MSD: 28.5 mm   7 w   6 d Subchorionic hemorrhage:  None visualized. Maternal uterus/adnexae: Ovaries are within normal limits. Right ovary measures 1.9 x 1.7 x 1.5 cm. Left ovary measures 2.1 x 1.5 x 1.5 cm. IMPRESSION: Single intrauterine gestational sac with mean sac diameter of 28.5 mm but no embryo. Findings meet definitive criteria for failed pregnancy. This follows SRU consensus guidelines: Diagnostic Criteria for Nonviable Pregnancy Early in the First Trimester. [facility] Mis J Med [phone]. Electronically Signed   By: Jasmine Pang M.D.   On: 04/04/2021 17:13     Assessment and Plan  Incomplete miscarriage  - Options given to both patient and husband: expectant management or medical management with Cytotec  - After privacy and time to talk with spouse, patient decided on expectant management - Information provided on incomplete miscarriage   Vaginal bleeding affecting early pregnancy  - Return to MAU: If you have heavier bleeding that soaks through more that 2 pads per hour for an hour or more If you bleed so much that you feel like you might pass out or you do pass out If you have significant abdominal pain that is not improved with Tylenol 1000 mg every 8 hours as needed for pain If you develop a fever > 100.5   Threatened  miscarriage in early pregnancy  - Information provided on threatened miscarriage   [redacted] weeks gestation of pregnancy   - Discharge patient - Go to [facility] on 04/11/2021 at 0900 for repeat HCG - Patient verbalized an understanding of the plan of care and agrees.    Raelyn Mora, CNM 04/04/2021, 3:25 PM

## 2021-04-05 ENCOUNTER — Encounter (HOSPITAL_COMMUNITY): Payer: Self-pay | Admitting: Obstetrics and Gynecology

## 2021-04-05 ENCOUNTER — Encounter (HOSPITAL_COMMUNITY)
Admission: AD | Disposition: A | Discharge status: Home / Self Care | Payer: Self-pay | Source: Home / Self Care | Attending: Obstetrics and Gynecology

## 2021-04-05 ENCOUNTER — Inpatient Hospital Stay (HOSPITAL_COMMUNITY)
Admission: AD | Admit: 2021-04-05 | Discharge: 2021-04-05 | Disposition: A | Discharge status: Home / Self Care | Payer: Medicaid Other | Attending: Obstetrics and Gynecology | Admitting: Obstetrics and Gynecology

## 2021-04-05 ENCOUNTER — Inpatient Hospital Stay (HOSPITAL_COMMUNITY): Payer: Medicaid Other | Admitting: Anesthesiology

## 2021-04-05 ENCOUNTER — Other Ambulatory Visit: Payer: Self-pay

## 2021-04-05 ENCOUNTER — Inpatient Hospital Stay (HOSPITAL_COMMUNITY): Payer: Medicaid Other

## 2021-04-05 DIAGNOSIS — Z20822 Contact with and (suspected) exposure to covid-19: Secondary | ICD-10-CM | POA: Insufficient documentation

## 2021-04-05 DIAGNOSIS — O039 Complete or unspecified spontaneous abortion without complication: Secondary | ICD-10-CM

## 2021-04-05 DIAGNOSIS — O034 Incomplete spontaneous abortion without complication: Secondary | ICD-10-CM

## 2021-04-05 DIAGNOSIS — O031 Delayed or excessive hemorrhage following incomplete spontaneous abortion: Secondary | ICD-10-CM | POA: Diagnosis present

## 2021-04-05 DIAGNOSIS — O209 Hemorrhage in early pregnancy, unspecified: Secondary | ICD-10-CM

## 2021-04-05 DIAGNOSIS — Z79899 Other long term (current) drug therapy: Secondary | ICD-10-CM | POA: Diagnosis not present

## 2021-04-05 HISTORY — PX: DILATION AND EVACUATION: SHX1459

## 2021-04-05 LAB — GC/CHLAMYDIA PROBE AMP (~~LOC~~) NOT AT ARMC
Chlamydia: NEGATIVE
Comment: NEGATIVE
Comment: NORMAL
Neisseria Gonorrhea: NEGATIVE

## 2021-04-05 LAB — CBC
HCT: 37.8 % (ref 36.0–46.0)
Hemoglobin: 12.2 g/dL (ref 12.0–15.0)
MCH: 30.3 pg (ref 26.0–34.0)
MCHC: 32.3 g/dL (ref 30.0–36.0)
MCV: 93.8 fL (ref 80.0–100.0)
Platelets: 303 10*3/uL (ref 150–400)
RBC: 4.03 MIL/uL (ref 3.87–5.11)
RDW: 12.3 % (ref 11.5–15.5)
WBC: 13.4 10*3/uL — ABNORMAL HIGH (ref 4.0–10.5)
nRBC: 0 % (ref 0.0–0.2)

## 2021-04-05 LAB — RESP PANEL BY RT-PCR (FLU A&B, COVID) ARPGX2
Influenza A by PCR: NEGATIVE
Influenza B by PCR: NEGATIVE
SARS Coronavirus 2 by RT PCR: NEGATIVE

## 2021-04-05 LAB — POCT I-STAT, CHEM 8
BUN: 5 mg/dL — ABNORMAL LOW (ref 6–20)
Calcium, Ion: 1.22 mmol/L (ref 1.15–1.40)
Chloride: 103 mmol/L (ref 98–111)
Creatinine, Ser: 0.6 mg/dL (ref 0.44–1.00)
Glucose, Bld: 84 mg/dL (ref 70–99)
HCT: 23 % — ABNORMAL LOW (ref 36.0–46.0)
Hemoglobin: 7.8 g/dL — ABNORMAL LOW (ref 12.0–15.0)
Potassium: 3.7 mmol/L (ref 3.5–5.1)
Sodium: 140 mmol/L (ref 135–145)
TCO2: 23 mmol/L (ref 22–32)

## 2021-04-05 LAB — PREPARE RBC (CROSSMATCH)

## 2021-04-05 LAB — HEMOGLOBIN AND HEMATOCRIT, BLOOD
HCT: 27.2 % — ABNORMAL LOW (ref 36.0–46.0)
Hemoglobin: 8.9 g/dL — ABNORMAL LOW (ref 12.0–15.0)

## 2021-04-05 SURGERY — DILATION AND EVACUATION, UTERUS
Anesthesia: General | Site: Perineum

## 2021-04-05 MED ORDER — DEXAMETHASONE SODIUM PHOSPHATE 10 MG/ML IJ SOLN
INTRAMUSCULAR | Status: DC | PRN
  Administered 2021-04-05: 4 mg via INTRAVENOUS

## 2021-04-05 MED ORDER — LIDOCAINE HCL (PF) 1 % IJ SOLN
INTRAMUSCULAR | Status: DC | PRN
Start: 2021-04-05 — End: 2021-04-05
  Administered 2021-04-05: 20 mL

## 2021-04-05 MED ORDER — ONDANSETRON HCL 4 MG/2ML IJ SOLN
INTRAMUSCULAR | Status: AC
  Filled 2021-04-05: qty 2

## 2021-04-05 MED ORDER — 0.9 % SODIUM CHLORIDE (POUR BTL) OPTIME
TOPICAL | Status: DC | PRN
  Administered 2021-04-05: 1000 mL

## 2021-04-05 MED ORDER — SODIUM CHLORIDE 0.9% IV SOLUTION
Freq: Once | INTRAVENOUS | Status: DC
Start: 2021-04-05 — End: 2021-04-05

## 2021-04-05 MED ORDER — LACTATED RINGERS IV SOLN: INTRAVENOUS | Status: DC | PRN

## 2021-04-05 MED ORDER — FENTANYL CITRATE (PF) 250 MCG/5ML IJ SOLN
INTRAMUSCULAR | Status: AC
  Filled 2021-04-05: qty 5

## 2021-04-05 MED ORDER — PROPOFOL 10 MG/ML IV BOLUS
INTRAVENOUS | Status: DC | PRN
  Administered 2021-04-05: 50 mg via INTRAVENOUS

## 2021-04-05 MED ORDER — IBUPROFEN 600 MG PO TABS: 600.0000 mg | ORAL_TABLET | Freq: Four times a day (QID) | ORAL | 0 refills | Status: DC | PRN

## 2021-04-05 MED ORDER — PROPOFOL 10 MG/ML IV BOLUS
INTRAVENOUS | Status: AC
  Filled 2021-04-05: qty 20

## 2021-04-05 MED ORDER — MIDAZOLAM HCL 5 MG/5ML IJ SOLN
INTRAMUSCULAR | Status: DC | PRN
  Administered 2021-04-05: 2 mg via INTRAVENOUS

## 2021-04-05 MED ORDER — MIDAZOLAM HCL 2 MG/2ML IJ SOLN
INTRAMUSCULAR | Status: AC
  Filled 2021-04-05: qty 2

## 2021-04-05 MED ORDER — FENTANYL CITRATE (PF) 100 MCG/2ML IJ SOLN: 25.0000 ug | INTRAMUSCULAR | Status: DC | PRN

## 2021-04-05 MED ORDER — ONDANSETRON HCL 4 MG/2ML IJ SOLN
INTRAMUSCULAR | Status: DC | PRN
  Administered 2021-04-05: 4 mg via INTRAVENOUS

## 2021-04-05 MED ORDER — ALBUMIN HUMAN 5 % IV SOLN: INTRAVENOUS | Status: DC | PRN

## 2021-04-05 MED ORDER — LIDOCAINE HCL (PF) 1 % IJ SOLN
INTRAMUSCULAR | Status: AC
  Filled 2021-04-05: qty 30

## 2021-04-05 MED ORDER — PROPOFOL 500 MG/50ML IV EMUL
INTRAVENOUS | Status: DC | PRN
  Administered 2021-04-05: 100 ug/kg/min via INTRAVENOUS

## 2021-04-05 MED ORDER — LACTATED RINGERS IV BOLUS: 1000.0000 mL | Freq: Once | INTRAVENOUS | Status: DC

## 2021-04-05 MED ORDER — AMMONIA AROMATIC IN INHA
RESPIRATORY_TRACT | Status: AC
  Filled 2021-04-05: qty 10

## 2021-04-05 MED ORDER — MISOPROSTOL 200 MCG PO TABS
800.0000 ug | ORAL_TABLET | Freq: Once | ORAL | Status: AC
  Administered 2021-04-05: 800 ug via BUCCAL
  Filled 2021-04-05: qty 4

## 2021-04-05 MED ORDER — FERROUS SULFATE 325 (65 FE) MG PO TABS: 325.0000 mg | ORAL_TABLET | ORAL | 3 refills | Status: AC

## 2021-04-05 MED ORDER — DOXYCYCLINE HYCLATE 100 MG IV SOLR
200.0000 mg | INTRAVENOUS | Status: AC
  Administered 2021-04-05: 200 mg via INTRAVENOUS
  Filled 2021-04-05: qty 200

## 2021-04-05 MED ORDER — DEXAMETHASONE SODIUM PHOSPHATE 10 MG/ML IJ SOLN
INTRAMUSCULAR | Status: AC
  Filled 2021-04-05: qty 1

## 2021-04-05 MED ORDER — LACTATED RINGERS IV BOLUS
1000.0000 mL | Freq: Once | INTRAVENOUS | Status: AC
  Administered 2021-04-05: 1000 mL via INTRAVENOUS

## 2021-04-05 MED ORDER — ACETAMINOPHEN 500 MG PO TABS: 500.0000 mg | ORAL_TABLET | Freq: Four times a day (QID) | ORAL | 0 refills | Status: AC | PRN

## 2021-04-05 MED ORDER — KETOROLAC TROMETHAMINE 30 MG/ML IJ SOLN
INTRAMUSCULAR | Status: DC | PRN
  Administered 2021-04-05: 30 mg via INTRAVENOUS

## 2021-04-05 MED ORDER — FENTANYL CITRATE (PF) 250 MCG/5ML IJ SOLN
INTRAMUSCULAR | Status: DC | PRN
  Administered 2021-04-05: 25 ug via INTRAVENOUS

## 2021-04-05 MED ORDER — PHENYLEPHRINE 40 MCG/ML (10ML) SYRINGE FOR IV PUSH (FOR BLOOD PRESSURE SUPPORT)
PREFILLED_SYRINGE | INTRAVENOUS | Status: DC | PRN
  Administered 2021-04-05 (×3): 80 ug via INTRAVENOUS

## 2021-04-05 MED ORDER — PHENYLEPHRINE HCL (PRESSORS) 10 MG/ML IV SOLN
INTRAVENOUS | Status: AC
  Filled 2021-04-05: qty 2

## 2021-04-05 MED ORDER — KETOROLAC TROMETHAMINE 30 MG/ML IJ SOLN
INTRAMUSCULAR | Status: AC
  Filled 2021-04-05: qty 1

## 2021-04-05 SURGICAL SUPPLY — 21 items
CATH ROBINSON RED A/P 16FR (CATHETERS) IMPLANT
DECANTER SPIKE VIAL GLASS SM (MISCELLANEOUS) ×2 IMPLANT
DRSG TELFA 3X8 NADH (GAUZE/BANDAGES/DRESSINGS) ×2 IMPLANT
FILTER UTR ASPR ASSEMBLY (MISCELLANEOUS) ×2 IMPLANT
GLOVE SURG ENC MOIS LTX SZ6 (GLOVE) ×2 IMPLANT
GLOVE SURG UNDER LTX SZ6.5 (GLOVE) ×4 IMPLANT
GOWN STRL REUS W/ TWL LRG LVL3 (GOWN DISPOSABLE) ×2 IMPLANT
GOWN STRL REUS W/TWL LRG LVL3 (GOWN DISPOSABLE) ×2
HOSE CONNECTING 18IN BERKELEY (TUBING) ×2 IMPLANT
KIT BERKELEY 1ST TRI 3/8 NO TR (MISCELLANEOUS) ×2 IMPLANT
KIT BERKELEY 1ST TRIMESTER 3/8 (MISCELLANEOUS) ×2 IMPLANT
NS IRRIG 1000ML POUR BTL (IV SOLUTION) ×2 IMPLANT
PACK VAGINAL MINOR WOMEN LF (CUSTOM PROCEDURE TRAY) ×2 IMPLANT
PAD OB MATERNITY 4.3X12.25 (PERSONAL CARE ITEMS) ×2 IMPLANT
SET BERKELEY SUCTION TUBING (SUCTIONS) ×2 IMPLANT
TOWEL GREEN STERILE FF (TOWEL DISPOSABLE) ×4 IMPLANT
UNDERPAD 30X36 HEAVY ABSORB (UNDERPADS AND DIAPERS) ×2 IMPLANT
VACURETTE 10 RIGID CVD (CANNULA) ×2 IMPLANT
VACURETTE 7MM CVD STRL WRAP (CANNULA) IMPLANT
VACURETTE 8 RIGID CVD (CANNULA) IMPLANT
VACURETTE 9 RIGID CVD (CANNULA) IMPLANT

## 2021-04-05 NOTE — H&P (Signed)
History    CSN: [id number]   Arrival date and time: 04/05/21 1505    Event Date/Time   First Provider Initiated Contact with Patient 04/05/21 1400        Chief Complaint  Patient presents with   Vaginal Bleeding    HPI Laura Cuevas is a 45 y.o. G2P0101 at [redacted]w[redacted]d who presents via EMS for vaginal bleeding. Patient diagnosed with blighted ovum yesterday & opted for expectant management. Heavy bleeding started this morning around 730 am. States she passed out in the shower. Per EMS, patient had multiple syncopal episodes when they picked her up. States she has passed several large clots & saturates a pad within a few minutes. Denies abdominal pain. She then was syncopal here while getting an [place] following miso administration 800.   OB History       Gravida  2   Para  1   Term  0   Preterm  1   AB  0   Living  1        SAB  0   IAB  0   Ectopic  0   Multiple  0   Live Births  1                   Past Medical History:  Diagnosis Date   Medical history non-contributory             Past Surgical History:  Procedure Laterality Date   NO PAST SURGERIES               Family History  Problem Relation Age of Onset   Thyroid cancer Mother     Hypertension Father     Diabetes Father     Heart disease Father     Colon cancer Maternal Uncle     Diabetes Paternal Uncle     Hypertension Paternal Uncle     Hypertension Paternal Aunt     Diabetes Paternal Aunt        Social History        Tobacco Use   Smoking status: Never   Smokeless tobacco: Never  Vaping Use   Vaping Use: Never used  Substance Use Topics   Alcohol use: No   Drug use: No      Allergies: No Known Allergies          Medications Prior to Admission  Medication Sig Dispense Refill Last Dose   Prenat w/o A Vit-FeFum-FePo-FA (CONCEPT OB) 130-92.4-1 MG CAPS Take 1 tablet by mouth daily. 30 capsule 12     prenatal vitamin w/FE, FA (PRENATAL 1 + 1) 27-1 MG TABS tablet Take 1 tablet by  mouth daily at 12 noon. 30 tablet 0     promethazine (PHENERGAN) 25 MG tablet Take 1 tablet (25 mg total) by mouth every 6 (six) hours as needed for nausea or vomiting. 30 tablet 0        Review of Systems  Constitutional: Negative.   Gastrointestinal: Negative.   Genitourinary:  Positive for vaginal bleeding.  Neurological:  Positive for syncope. Negative for dizziness and headaches.  Physical Exam    Blood pressure (!) 109/92, pulse (!) 112, temperature 98.6 F (37 C), temperature source Oral, resp. rate 17, last menstrual period 01/14/2021, SpO2 100 %, unknown if currently breastfeeding.   Physical Exam Vitals and nursing note reviewed. Exam conducted with a chaperone present.  Constitutional:      General: She is not in acute distress.  Appearance: Normal appearance. She is not diaphoretic.  HENT:     Head: Normocephalic and atraumatic.  Eyes:     General: No scleral icterus. Cardiovascular:     Rate and Rhythm: Regular rhythm. Tachycardia present.  Pulmonary:     Effort: Pulmonary effort is normal. No respiratory distress.  Abdominal:     General: Abdomen is flat.     Palpations: Abdomen is soft.     Tenderness: There is no abdominal tenderness.  Genitourinary:    Comments: Large amount of blood & several large clots removed from vagina. Small trickle of bleeding from os. No tissue at os.  Skin:    General: Skin is warm and dry.  Neurological:     General: No focal deficit present.     Mental Status: She is alert.  Psychiatric:        Mood and Affect: Mood normal.        Behavior: Behavior normal.      Assessment and Plan  RPOC following SAB - No sac noted but due to syncopal episodes, will take her to the main OR for D&C for the remainder of the POC - Discussed risks/benefits and alternatives. She has already tried miso and expectant management.  - Consent reviewed and patient signed following questions with her partner.  - Discussed possible d/c home following  but will depend on labs and her status/vitals in recovery after the procedure.   Milas Hock, MD

## 2021-04-05 NOTE — Transfer of Care (Signed)
Immediate Anesthesia Transfer of Care Note  Patient: Teonna Coonan  Procedure(s) Performed: DILATATION AND EVACUATION (Perineum)  Patient Location: PACU  Anesthesia Type:MAC  Level of Consciousness: drowsy and patient cooperative  Airway & Oxygen Therapy: Patient Spontanous Breathing and Patient connected to nasal cannula oxygen  Post-op Assessment: Report given to RN and Post -op Vital signs reviewed and stable  Post vital signs: Reviewed and stable  Last Vitals:  Vitals Value Taken Time  BP 96/44 04/05/21 1528  Temp    Pulse 81 04/05/21 1530  Resp 21 04/05/21 1530  SpO2 100 % 04/05/21 1530  Vitals shown include unvalidated device data.  Last Pain:  Vitals:   04/05/21 1210  TempSrc: Oral  PainSc: 1          Complications: No notable events documented.

## 2021-04-05 NOTE — Anesthesia Postprocedure Evaluation (Signed)
Anesthesia Post Note  Patient: Laura Cuevas  Procedure(s) Performed: DILATATION AND EVACUATION (Perineum)     Patient location during evaluation: PACU Anesthesia Type: General Level of consciousness: awake Pain management: pain level controlled Respiratory status: spontaneous breathing Cardiovascular status: stable Postop Assessment: no apparent nausea or vomiting Anesthetic complications: no   No notable events documented.  Last Vitals:  Vitals:   04/05/21 1545 04/05/21 1600  BP: (!) 98/43 (!) 106/52  Pulse: 75 73  Resp: 18 18  Temp:    SpO2: 100% 100%    Last Pain:  Vitals:   04/05/21 1530  TempSrc:   PainSc: Asleep                 Jacobi Nile

## 2021-04-05 NOTE — Progress Notes (Addendum)
RN called to room. IV fluids complete. flushed and saline locked. Patient asked for juice to drink.I notified patient that I would have to speak with her provider. Patient awake alert and oriented x3. Provider awaiting [place] results. Patient made aware.   1355 Husband called from room door that his wife was feeling faint and seemed to pass out. Lunette Stands, NP in attendance at bedside.  Patient came to with ammonia prep and sternal rub. 1 Liter of LR hung at providers request. Dr. Adrian Blackwater called to bedside.

## 2021-04-05 NOTE — MAU Note (Addendum)
Patient's husband called out at 1400 stating his wife had fainted. Kerin Salen, RN, at bedside. Sydell Axon, RN, notified Judeth Horn, NP, to come to bedside. BP found to be 67/38. Provider placed patient in trendelenburg and ordered an LR bolus. This RN at bedside at 1403.  Dr. Adrian Blackwater, MD, at bedside at 1405. MD requested another IV to be started on patient. This RN informed provider the patient already had two IV sites. MD ordered another bag of LR to be administered via bolus. MD requested for two units of blood to be prepped and ready if need be. Order placed.  ROB RN Erin Foley at bedside at 1408.  Dr. Milas Hock, MD, at bedside at 1410. Dr. Para March informed patient that it would be best that patient go to the OR for a D&E due to blood loss and presentation. Informed consent given and signed.   OR called at 1418 to ask if patient would be going directly to the OR or to Short Stay. This RN informed OR that she would be going directly to the OR.   Patient taken to OR at 1426. Dr. Adrian Blackwater, MD, en route with this RN and Kerin Salen, RN to main OR.  Patient required multiple sternal rubs with MD's at bedside.

## 2021-04-05 NOTE — MAU Provider Note (Signed)
History     CSN: [id number]  Arrival date and time: 04/05/21 4403   Event Date/Time   First Provider Initiated Contact with Patient 04/05/21 [phone]      Chief Complaint  Patient presents with   Vaginal Bleeding   HPI Laura Cuevas is a 45 y.o. G2P0101 at [redacted]w[redacted]d who presents via EMS for vaginal bleeding. Patient diagnosed with blighted ovum yesterday & opted for expectant management. Heavy bleeding started this morning around 730 am. States she passed out in the shower. Per EMS, patient had multiple syncopal episodes when they picked her up. States she has passed several large clots & saturates a pad within a few minutes. Denies abdominal pain.   OB History     Gravida  2   Para  1   Term  0   Preterm  1   AB  0   Living  1      SAB  0   IAB  0   Ectopic  0   Multiple  0   Live Births  1           Past Medical History:  Diagnosis Date   Medical history non-contributory     Past Surgical History:  Procedure Laterality Date   NO PAST SURGERIES      Family History  Problem Relation Age of Onset   Thyroid cancer Mother    Hypertension Father    Diabetes Father    Heart disease Father    Colon cancer Maternal Uncle    Diabetes Paternal Uncle    Hypertension Paternal Uncle    Hypertension Paternal Aunt    Diabetes Paternal Aunt     Social History   Tobacco Use   Smoking status: Never   Smokeless tobacco: Never  Vaping Use   Vaping Use: Never used  Substance Use Topics   Alcohol use: No   Drug use: No    Allergies: No Known Allergies  Medications Prior to Admission  Medication Sig Dispense Refill Last Dose   Prenat w/o A Vit-FeFum-FePo-FA (CONCEPT OB) 130-92.4-1 MG CAPS Take 1 tablet by mouth daily. 30 capsule 12    prenatal vitamin w/FE, FA (PRENATAL 1 + 1) 27-1 MG TABS tablet Take 1 tablet by mouth daily at 12 noon. 30 tablet 0    promethazine (PHENERGAN) 25 MG tablet Take 1 tablet (25 mg total) by mouth every 6 (six) hours as needed for  nausea or vomiting. 30 tablet 0     Review of Systems  Constitutional: Negative.   Gastrointestinal: Negative.   Genitourinary:  Positive for vaginal bleeding.  Neurological:  Positive for syncope. Negative for dizziness and headaches.  Physical Exam   Blood pressure 107/60, pulse 75, temperature 98.6 F (37 C), temperature source Oral, resp. rate 12, last menstrual period 01/14/2021, SpO2 100 %, unknown if currently breastfeeding.  Physical Exam Vitals and nursing note reviewed. Exam conducted with a chaperone present.  Constitutional:      General: She is not in acute distress.    Appearance: Normal appearance. She is not diaphoretic.  HENT:     Head: Normocephalic and atraumatic.  Eyes:     General: No scleral icterus. Cardiovascular:     Rate and Rhythm: Regular rhythm. Tachycardia present.  Pulmonary:     Effort: Pulmonary effort is normal. No respiratory distress.  Abdominal:     General: Abdomen is flat.     Palpations: Abdomen is soft.     Tenderness: There is no abdominal  tenderness.  Genitourinary:    Comments: Large amount of blood & several large clots removed from vagina. Small trickle of bleeding from os. No tissue at os.  Skin:    General: Skin is warm and dry.  Neurological:     General: No focal deficit present.     Mental Status: She is alert.  Psychiatric:        Mood and Affect: Mood normal.        Behavior: Behavior normal.    MAU Course  Procedures Results for orders placed or performed during the hospital encounter of 04/05/21 (from the past 24 hour(s))  CBC     Status: Abnormal   Collection Time: 04/05/21  9:59 AM  Result Value Ref Range   WBC 13.4 (H) 4.0 - 10.5 K/uL   RBC 4.03 3.87 - 5.11 MIL/uL   Hemoglobin 12.2 12.0 - 15.0 g/dL   HCT 38.1 82.9 - 93.7 %   MCV 93.8 80.0 - 100.0 fL   MCH 30.3 26.0 - 34.0 pg   MCHC 32.3 30.0 - 36.0 g/dL   RDW 16.9 67.8 - 93.8 %   Platelets 303 150 - 400 K/uL   nRBC 0.0 0.0 - 0.2 %  Type and screen      Status: None (Preliminary result)   Collection Time: 04/05/21  9:59 AM  Result Value Ref Range   ABO/RH(D) O POS    Antibody Screen NEG    Sample Expiration      04/08/2021,2359 Performed at [facility], [address] [place], [place] [address]    Unit Number [id number]    Blood Component Type RED CELLS,LR    Unit division 00    Status of Unit ALLOCATED    Transfusion Status OK TO TRANSFUSE    Crossmatch Result Compatible    Unit Number [id number]    Blood Component Type RED CELLS,LR    Unit division 00    Status of Unit ALLOCATED    Transfusion Status OK TO TRANSFUSE    Crossmatch Result Compatible   Prepare RBC (crossmatch)     Status: None   Collection Time: 04/05/21  2:04 PM  Result Value Ref Range   Order Confirmation      ORDER PROCESSED BY BLOOD BANK Performed at [facility], [address] [place], [place] [address]    [place] Transvaginal  Result Date: 04/05/2021 CLINICAL DATA:  Heavy vaginal bleeding.  Failed pregnancy. EXAM: TRANSVAGINAL OB ULTRASOUND TECHNIQUE: Transvaginal ultrasound was performed for complete evaluation of the gestation as well as the maternal uterus, adnexal regions, and pelvic cul-de-sac. COMPARISON:  OB ultrasound from yesterday. FINDINGS: Intrauterine gestational sac: None. Maternal uterus/adnexae: Thickened, heterogeneous endometrium with areas of increased vascularity. The ovaries are not visualized. Trace free fluid in the pelvis. IMPRESSION: 1. The intrauterine gestational sac is no longer identified and the endometrium is thickened and heterogeneous with increased vascularity. Findings are consistent with ongoing abortion. Electronically Signed   By: Obie Dredge M.D.   On: 04/05/2021 14:02   US OB LESS THAN 14 WEEKS WITH OB TRANSVAGINAL  Result Date: 04/04/2021 CLINICAL DATA:  Vaginal bleeding for 1 day EXAM: OBSTETRIC <14 WK [place] AND TRANSVAGINAL OB US TECHNIQUE: Both transabdominal and transvaginal ultrasound  examinations were performed for complete evaluation of the gestation as well as the maternal uterus, adnexal regions, and pelvic cul-de-sac. Transvaginal technique was performed to assess early pregnancy. COMPARISON:  None. FINDINGS: Intrauterine gestational sac: Single intrauterine gestational sac Yolk sac:  Not visualized Embryo:  Not visualized MSD: 28.5 mm   7 w   6 d Subchorionic hemorrhage:  None visualized. Maternal uterus/adnexae: Ovaries are within normal limits. Right ovary measures 1.9 x 1.7 x 1.5 cm. Left ovary measures 2.1 x 1.5 x 1.5 cm. IMPRESSION: Single intrauterine gestational sac with mean sac diameter of 28.5 mm but no embryo. Findings meet definitive criteria for failed pregnancy. This follows SRU consensus guidelines: Diagnostic Criteria for Nonviable Pregnancy Early in the First Trimester. [facility] Mis J Med [phone]. Electronically Signed   By: Jasmine Pang M.D.   On: 04/04/2021 17:13    MDM Patient diagnosed with blighted ovum yesterday & opted for expectant management.  On intake she is hypotensive but vitals improved with IV fluids. After exam, patient was given 800 mcg of buccal cytotec. Bleeding monitored - 350 cc per typhon. Sent to ultrasound after 2 hours of monitoring. After returning from ultrasound she had another syncopal episode - came to with sternal rub & ammonia tab. Hypotensive. Place in trendelenburg, IV fluid bolus hung, H&H ordered.  Drs. Stinson & Para March called to bedside.  Decision made to proceed to OR for D&C.   Assessment and Plan   1. Retained products of conception after miscarriage   2. Miscarriage   3. Vaginal bleeding in pregnancy, first trimester    -to OR with Dr. Sherre Poot 04/05/2021, 2:32 PM

## 2021-04-05 NOTE — Anesthesia Preprocedure Evaluation (Signed)
Anesthesia Evaluation  Patient identified by MRN, date of birth, ID band Patient awake    Reviewed: Allergy & Precautions, NPO status , Patient's Chart, lab work & pertinent test results  Airway Mallampati: I  TM Distance: >3 FB     Dental   Pulmonary neg pulmonary ROS,    breath sounds clear to auscultation       Cardiovascular  Rhythm:Regular Rate:Normal     Neuro/Psych negative neurological ROS     GI/Hepatic negative GI ROS, Neg liver ROS,   Endo/Other  negative endocrine ROS  Renal/GU negative Renal ROS     Musculoskeletal   Abdominal   Peds  Hematology   Anesthesia Other Findings   Reproductive/Obstetrics History noted Dr. Chilton Si                             Anesthesia Physical Anesthesia Plan  ASA: 1 and emergent  Anesthesia Plan: General   Post-op Pain Management:    Induction: Intravenous  PONV Risk Score and Plan: 3 and Ondansetron and Midazolam  Airway Management Planned: Nasal Cannula and Simple Face Mask  Additional Equipment:   Intra-op Plan:   Post-operative Plan:   Informed Consent: I have reviewed the patients History and Physical, chart, labs and discussed the procedure including the risks, benefits and alternatives for the proposed anesthesia with the patient or authorized representative who has indicated his/her understanding and acceptance.     Dental advisory given  Plan Discussed with: CRNA and Anesthesiologist  Anesthesia Plan Comments:         Anesthesia Quick Evaluation

## 2021-04-05 NOTE — MAU Note (Addendum)
...  Laura Cuevas is a 45 y.o. at [redacted]w[redacted]d here in MAU via EMS reporting: Around 0745 this morning she began bleeding vaginally and passing large clots. She states she wanted to take a shower in order to wash off and she states she started feeling as if she was going to faint. Patient states she called her husband into the bathroom and he was able to reach her before she hit the floor. Patients husband endorses that the patient fainted. He states that she never fell to the floor as he was able to lower her into the tub. He states he then picked her up out of the tub and she fainted again so he placed her onto the bathroom floor and called EMS. EMS in-coded stating the patient was hypotensive, soaked through three pads, and had a temp of 96.5 F. EMS started an IV and administered a 250 mL bolus of NS.  Patient was seen in MAU yesterday for vaginal bleeding and diagnosed with a failed pregnancy.  Pain score: Denies pain.  Vitals:   04/05/21 1017 04/05/21 1026  BP: (!) 86/57 (!) 98/56  Pulse: 98 67  Resp:    Temp:    SpO2:

## 2021-04-05 NOTE — Anesthesia Procedure Notes (Signed)
Procedure Name: MAC Date/Time: 04/05/2021 3:13 PM Performed by: Renato Shin, CRNA Pre-anesthesia Checklist: Patient identified, Emergency Drugs available, Suction available and Patient being monitored Patient Re-evaluated:Patient Re-evaluated prior to induction Oxygen Delivery Method: Nasal cannula Preoxygenation: Pre-oxygenation with 100% oxygen Induction Type: IV induction Placement Confirmation: positive ETCO2 and breath sounds checked- equal and bilateral Dental Injury: Teeth and Oropharynx as per pre-operative assessment

## 2021-04-05 NOTE — Op Note (Signed)
Preop: Incomplete abortion/RPOC Postop: Same Op: Dilation and evacuation Surgeon: Dr. Damita Dunnings Assist: None Anesthesia: MAC and 1%lidocaine paracervical block EBL 200 cc IVF: 1700cc crystalloid, 250cc albumin, 1u PRBC UOP: Not drained Complications: None  Findings: Normal anteverted uterus, products of conception noted just within her os. 200 cc of clots noted during vaginal prep for the surgery. Minimal blood loss from the surgery itself. 350 cc blood loss in MAU and pt noted passage of clots and blood loss prior to arrival.   Description of the procedure: Informed consent reviewed and signed. Pt given opportunity to ask questions but after consent was obtained she was rapidly taken to the Main OR for D&E.   Pt prepped and draped in the dorsal lithotomy fashion after CMA anesthesia found to be adequate. Timeout performed.   Open-sided speculum placed into the vagina. Cervix grasped with a single tooth tenaculum. Cervix numbed via paracervical block with 1% lidocaine at 4 and 8 oclock with 20 cc total. I used a ring forcep and gentle removed the products of conception. They appeared intact and complete but I then used a 10 mm rigid curette. Two  passes done with the suction curette and the tissue was retrieved. A gritty texture noted in all areas of the uterus confirming complete evacuation. Minimal bleeding noted. Procedure completed. All instruments removed. Counts correct x2.   Products of conception sent to pathology. Rh Positive - rhogam not indicated  Pt taken to recovery room in stable condition. Anticipate discharge home after recovery complete.

## 2021-04-06 ENCOUNTER — Encounter (HOSPITAL_COMMUNITY): Payer: Self-pay | Admitting: Obstetrics and Gynecology

## 2021-04-06 LAB — TYPE AND SCREEN
ABO/RH(D): O POS
Antibody Screen: NEGATIVE
Unit division: 0
Unit division: 0

## 2021-04-06 LAB — BPAM RBC
Blood Product Expiration Date: 202210242359
Blood Product Expiration Date: 202210242359
ISSUE DATE / TIME: 202210181505
Unit Type and Rh: 5100
Unit Type and Rh: 5100

## 2021-04-06 LAB — SURGICAL PATHOLOGY

## 2021-04-11 ENCOUNTER — Other Ambulatory Visit: Payer: Medicaid Other

## 2021-04-14 ENCOUNTER — Encounter: Payer: Medicaid Other | Admitting: Family Medicine

## 2021-04-20 ENCOUNTER — Other Ambulatory Visit: Payer: Self-pay

## 2021-04-20 ENCOUNTER — Encounter: Payer: Self-pay | Admitting: Obstetrics and Gynecology

## 2021-04-20 ENCOUNTER — Ambulatory Visit (INDEPENDENT_AMBULATORY_CARE_PROVIDER_SITE_OTHER): Payer: Self-pay | Admitting: Obstetrics and Gynecology

## 2021-04-20 ENCOUNTER — Other Ambulatory Visit (HOSPITAL_COMMUNITY)
Admission: RE | Admit: 2021-04-20 | Discharge: 2021-04-20 | Disposition: A | Discharge status: Home / Self Care | Payer: Medicaid Other | Source: Ambulatory Visit | Attending: Obstetrics and Gynecology | Admitting: Obstetrics and Gynecology

## 2021-04-20 VITALS — BP 115/77 | HR 88 | Ht 59.0 in | Wt 124.6 lb

## 2021-04-20 DIAGNOSIS — Z3009 Encounter for other general counseling and advice on contraception: Secondary | ICD-10-CM

## 2021-04-20 DIAGNOSIS — Z124 Encounter for screening for malignant neoplasm of cervix: Secondary | ICD-10-CM | POA: Diagnosis present

## 2021-04-20 DIAGNOSIS — O039 Complete or unspecified spontaneous abortion without complication: Secondary | ICD-10-CM

## 2021-04-20 NOTE — Progress Notes (Signed)
GYNECOLOGY OFFICE VISIT NOTE  History:   Laura Cuevas is a 45 y.o. G2P0101 here today for follow up from miscarriage.   She had a D&C emergently due to hypotension and LOC. She was transfused.    She denies any abnormal vaginal discharge, bleeding, pelvic pain or other concerns.     Past Medical History:  Diagnosis Date   Medical history non-contributory     Past Surgical History:  Procedure Laterality Date   DILATION AND EVACUATION N/A 04/05/2021   Procedure: DILATATION AND EVACUATION;  Surgeon: Milas Hock, MD;  Location: [facility] OR;  Service: Gynecology;  Laterality: N/A;   NO PAST SURGERIES      The following portions of the patient's history were reviewed and updated as appropriate: allergies, current medications, past family history, past medical history, past social history, past surgical history and problem list.   Health Maintenance:   No results found for: DIAGPAP   She has not yet had a mammogram  Review of Systems:  Pertinent items noted in HPI and remainder of comprehensive ROS otherwise negative.  Physical Exam:  BP 115/77   Pulse 88   Ht 4\' 11"  (1.499 m)   Wt 124 lb 9.6 oz (56.5 kg)   LMP 01/14/2021   Breastfeeding No   BMI 25.17 kg/m  CONSTITUTIONAL: Well-developed, well-nourished female in no acute distress.  HEENT:  Normocephalic, atraumatic. External right and left ear normal. No scleral icterus.  NECK: Normal range of motion, supple, no masses noted on observation SKIN: No rash noted. Not diaphoretic. No erythema. No pallor. MUSCULOSKELETAL: Normal range of motion. No edema noted. NEUROLOGIC: Alert and oriented to person, place, and time. Normal muscle tone coordination. No cranial nerve deficit noted. PSYCHIATRIC: Normal mood and affect. Normal behavior. Normal judgment and thought content.  CARDIOVASCULAR: Normal heart rate noted RESPIRATORY: Effort and breath sounds normal, no problems with respiration noted ABDOMEN: No masses noted. No other  overt distention noted.    PELVIC: Normal appearing external genitalia; normal urethral meatus; normal appearing vaginal mucosa and cervix.  No abnormal discharge noted.  Normal uterine size, no other palpable masses, no uterine or adnexal tenderness. Performed in the presence of a chaperone  Labs and Imaging No results found for this or any previous visit (from the past 168 hour(s)). 01/16/2021 OB Transvaginal  Result Date: 04/05/2021 CLINICAL DATA:  Heavy vaginal bleeding.  Failed pregnancy. EXAM: TRANSVAGINAL OB ULTRASOUND TECHNIQUE: Transvaginal ultrasound was performed for complete evaluation of the gestation as well as the maternal uterus, adnexal regions, and pelvic cul-de-sac. COMPARISON:  OB ultrasound from yesterday. FINDINGS: Intrauterine gestational sac: None. Maternal uterus/adnexae: Thickened, heterogeneous endometrium with areas of increased vascularity. The ovaries are not visualized. Trace free fluid in the pelvis. IMPRESSION: 1. The intrauterine gestational sac is no longer identified and the endometrium is thickened and heterogeneous with increased vascularity. Findings are consistent with ongoing abortion. Electronically Signed   By: 04/07/2021 M.D.   On: 04/05/2021 14:02   04/07/2021 OB LESS THAN 14 WEEKS WITH OB TRANSVAGINAL  Result Date: 04/04/2021 CLINICAL DATA:  Vaginal bleeding for 1 day EXAM: OBSTETRIC <14 WK 04/06/2021 AND TRANSVAGINAL OB US TECHNIQUE: Both transabdominal and transvaginal ultrasound examinations were performed for complete evaluation of the gestation as well as the maternal uterus, adnexal regions, and pelvic cul-de-sac. Transvaginal technique was performed to assess early pregnancy. COMPARISON:  None. FINDINGS: Intrauterine gestational sac: Single intrauterine gestational sac Yolk sac:  Not visualized Embryo:  Not visualized MSD: 28.5 mm   7  w   6 d Subchorionic hemorrhage:  None visualized. Maternal uterus/adnexae: Ovaries are within normal limits. Right ovary measures 1.9 x  1.7 x 1.5 cm. Left ovary measures 2.1 x 1.5 x 1.5 cm. IMPRESSION: Single intrauterine gestational sac with mean sac diameter of 28.5 mm but no embryo. Findings meet definitive criteria for failed pregnancy. This follows SRU consensus guidelines: Diagnostic Criteria for Nonviable Pregnancy Early in the First Trimester. [facility] Mis J Med [phone]. Electronically Signed   By: Jasmine Pang M.D.   On: 04/04/2021 17:13    Assessment and Plan:    1. Birth control counseling - Discussed all options for birth control - She declines birth control. They would like to TTC  2. Complete miscarriage - She has recovered from her loss.  - She has no complaints - Discussed early care and early [place] - Wait for next normal period and then they may TTC - Discussed common causes of SAB. Reassured her this was nothing in her control.  - Discussed plan for subsequent pregnancy i.e. serials betas and/or early [place] (typically around 7-8wks) - Discussed that she should wait for one normal cycle and then they may begin trying again if she feels emotionally ready as well - Answered all questions   3. Pap smear for cervical cancer screening - Pap with HPV collected - Mammogram done through scholarship   Routine preventative health maintenance measures emphasized. Please refer to After Visit Summary for other counseling recommendations.   No follow-ups on file.  I spent 25 minutes dedicated to the care of this patient including pre-visit review of records, face to face time with the patient discussing her conditions and treatments and post visit orders.    Milas Hock, MD, FACOG Obstetrician & Gynecologist, [facility] for [facility], [facility]

## 2021-04-25 LAB — CYTOLOGY - PAP
Comment: NEGATIVE
Diagnosis: NEGATIVE
High risk HPV: NEGATIVE

## 2021-05-27 ENCOUNTER — Ambulatory Visit: Payer: Medicaid Other

## 2022-04-18 ENCOUNTER — Ambulatory Visit: Payer: Medicaid Other | Admitting: Advanced Practice Midwife

## 2022-04-27 ENCOUNTER — Ambulatory Visit: Payer: Medicaid Other | Admitting: Student

## 2022-12-26 IMAGING — US US OB < 14 WEEKS - US OB TV
1 series · 15 of 28 positions shown · non-contrast
Comparison: None.

CLINICAL DATA: Vaginal bleeding for 1 day

EXAM:
OBSTETRIC <14 WK US AND TRANSVAGINAL OB US
TECHNIQUE: Both transabdominal and transvaginal ultrasound examinations were
performed for complete evaluation of the gestation as well as the
maternal uterus, adnexal regions, and pelvic cul-de-sac.
Transvaginal technique was performed to assess early pregnancy.

[Series 1: us ob < 14 weeks - us ob tv · 15 of 49 slices shown]
[im 1/49]
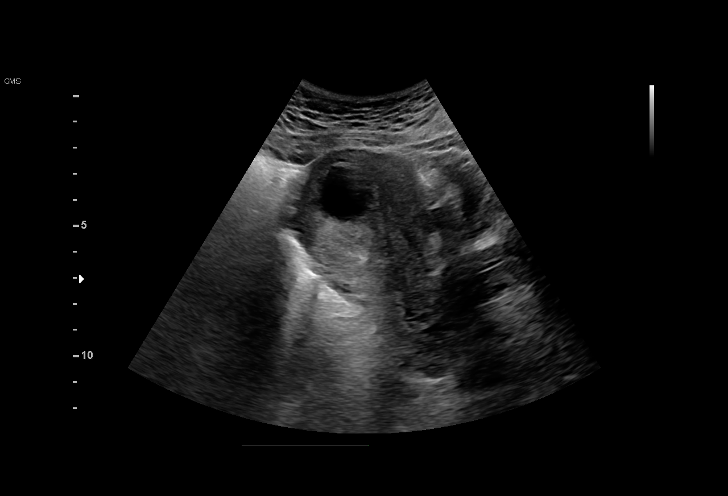
[im 4/49]
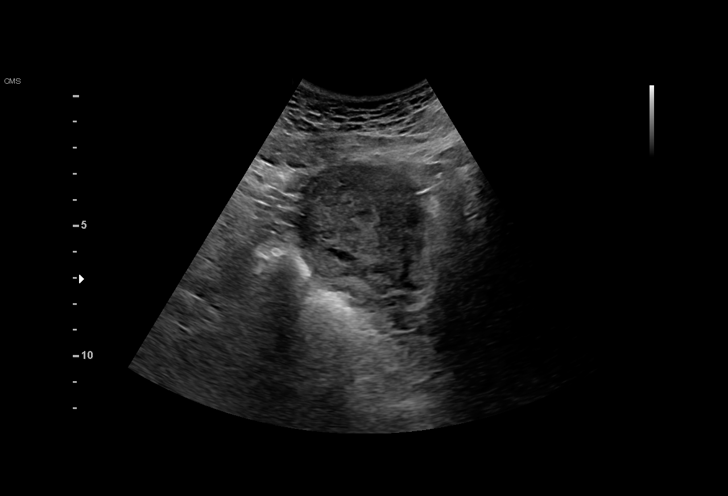
[im 8/49]
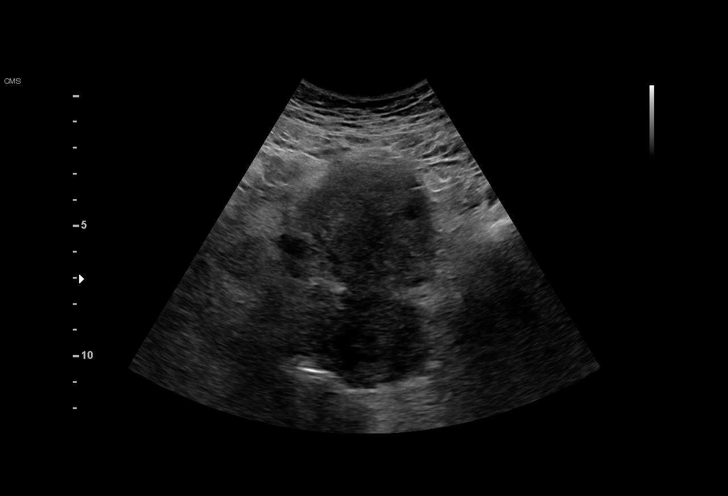
[im 11/49]
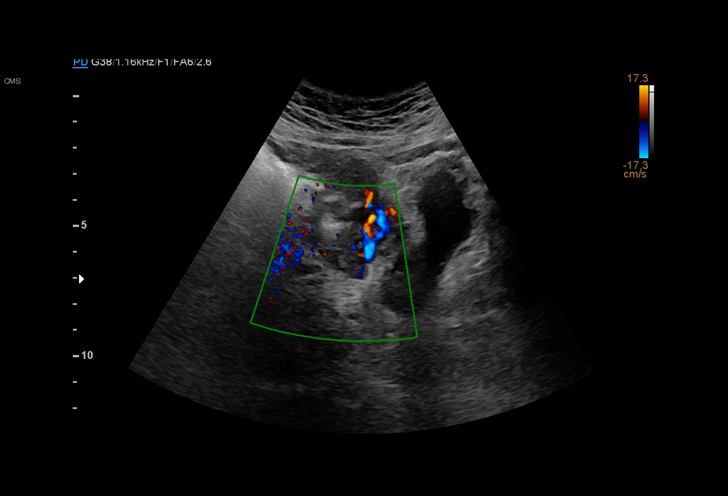
[im 15/49]
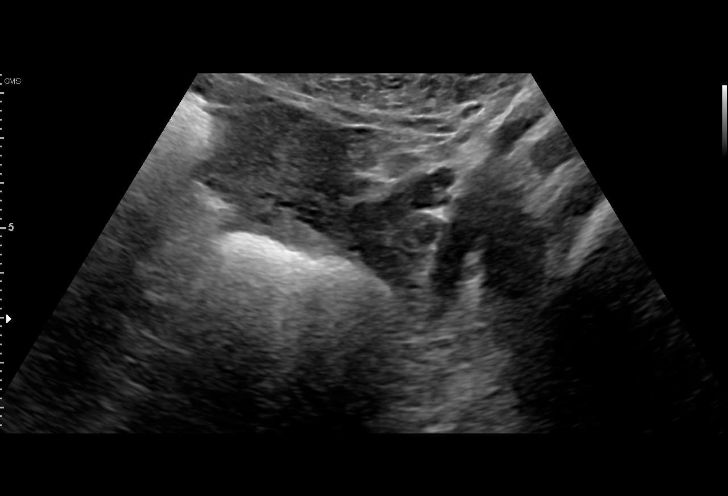
[im 18/49]
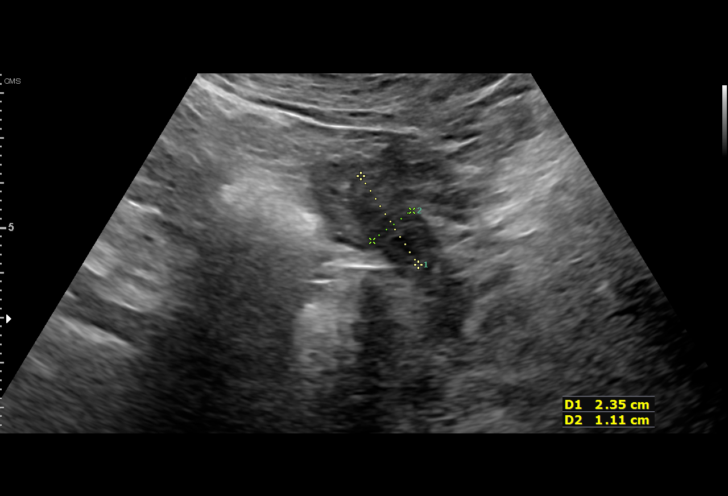
[im 22/49]
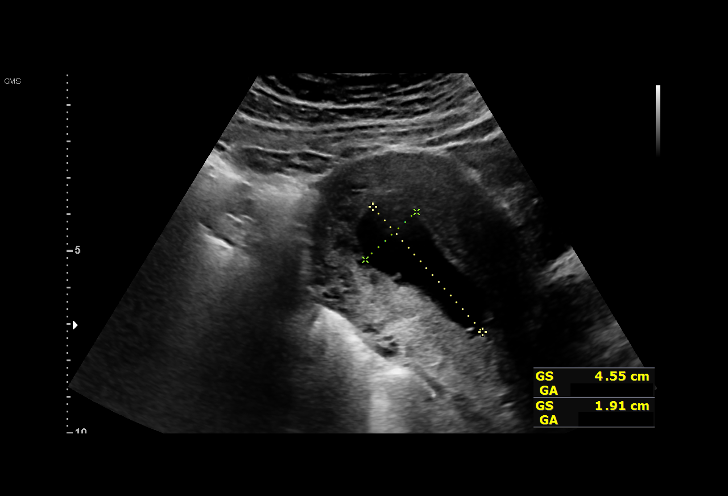
[im 25/49]
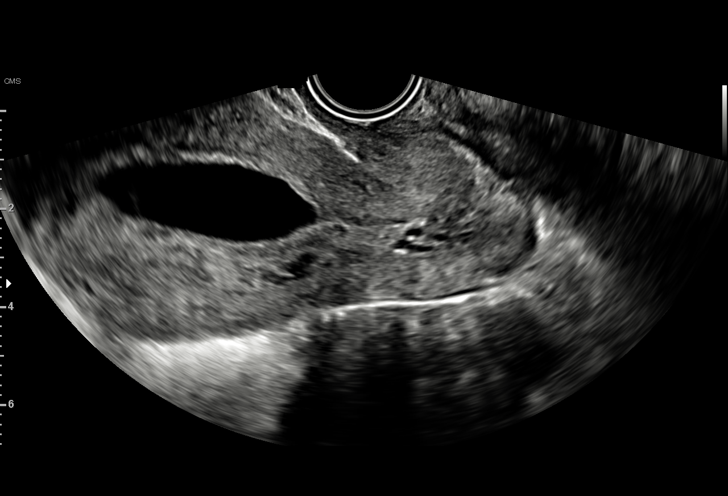
[im 27/49]
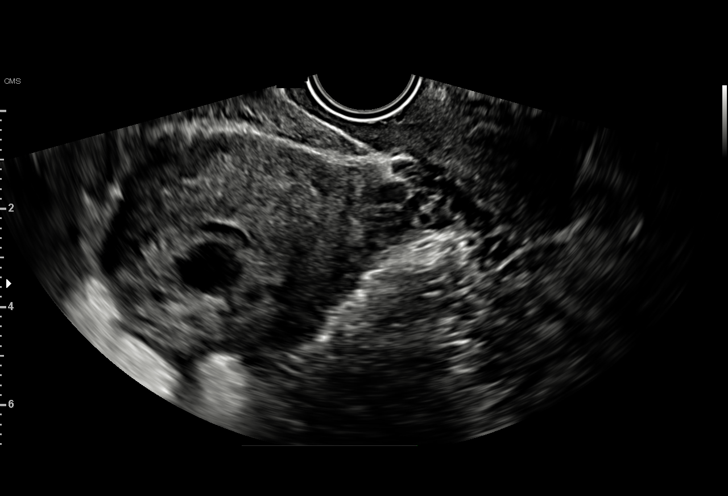
[im 31/49]
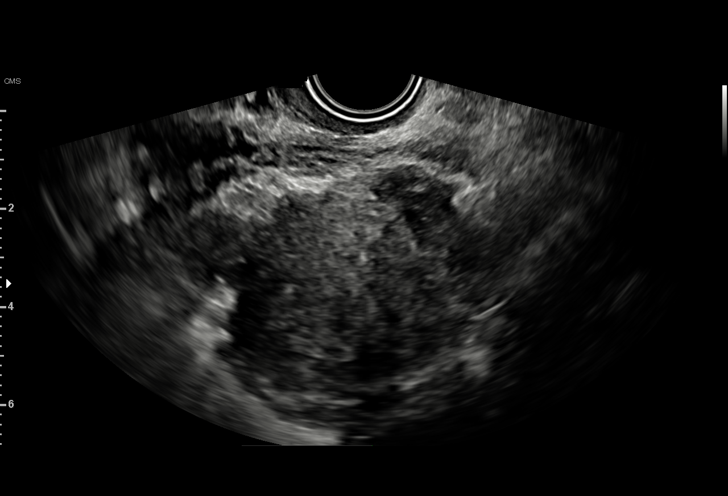
[im 34/49]
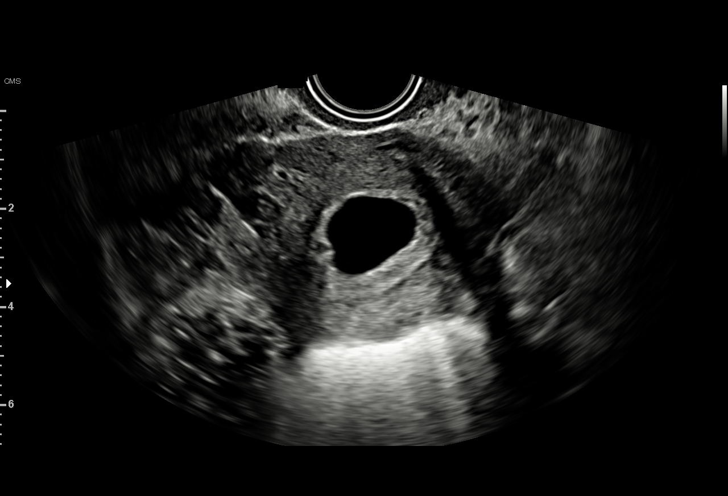
[im 38/49]
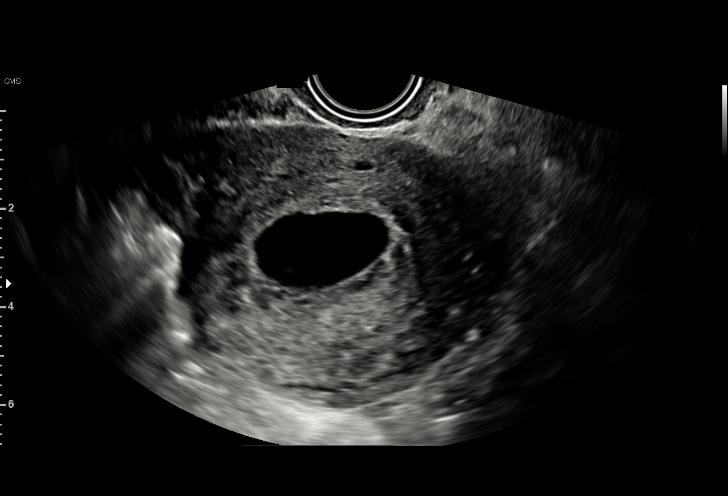
[im 41/49]
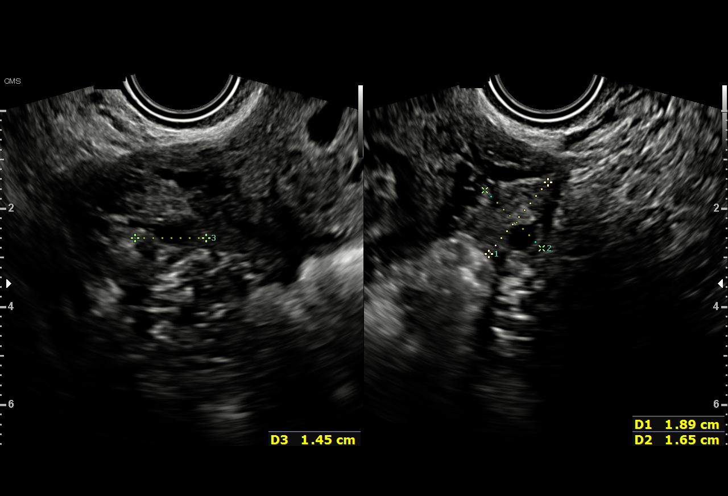
[im 45/49]
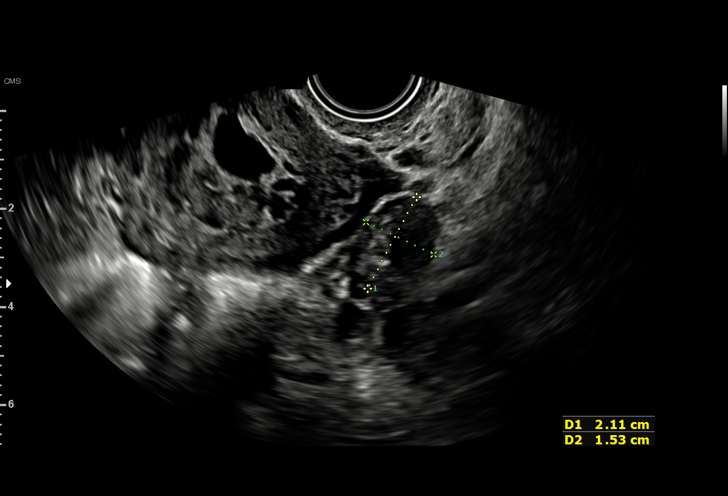
[im 49/49]
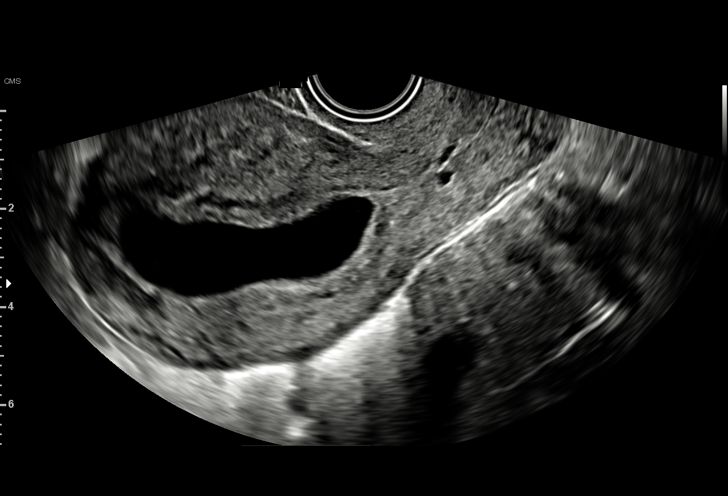

[15 of 28 positions shown; findings below may reference images not displayed]

FINDINGS: Intrauterine gestational sac: Single intrauterine gestational sac

Yolk sac:  Not visualized

Embryo:  Not visualized

MSD: 28.5 mm   7 w   6 d

Subchorionic hemorrhage:  None visualized.

Maternal uterus/adnexae: Ovaries are within normal limits. Right
ovary measures 1.9 x 1.7 x 1.5 cm. Left ovary measures 2.1 x 1.5 x
1.5 cm.
IMPRESSION: Single intrauterine gestational sac with mean sac diameter of
mm but no embryo. Findings meet definitive criteria for failed
pregnancy. This follows SRU consensus guidelines: Diagnostic
Criteria for Nonviable Pregnancy Early in the First Trimester. N
Engl J Med 8055;[DATE].

## 2022-12-27 IMAGING — US US OB TRANSVAGINAL
1 series · 15 of 28 positions shown · non-contrast
Comparison: OB ultrasound from yesterday.

CLINICAL DATA: Heavy vaginal bleeding.  Failed pregnancy.

EXAM:
TRANSVAGINAL OB ULTRASOUND
TECHNIQUE: Transvaginal ultrasound was performed for complete evaluation of the
gestation as well as the maternal uterus, adnexal regions, and
pelvic cul-de-sac.

[Series 1: us ob transvaginal · 33 acquisitions, 15 frames shown]
[im 1/33]
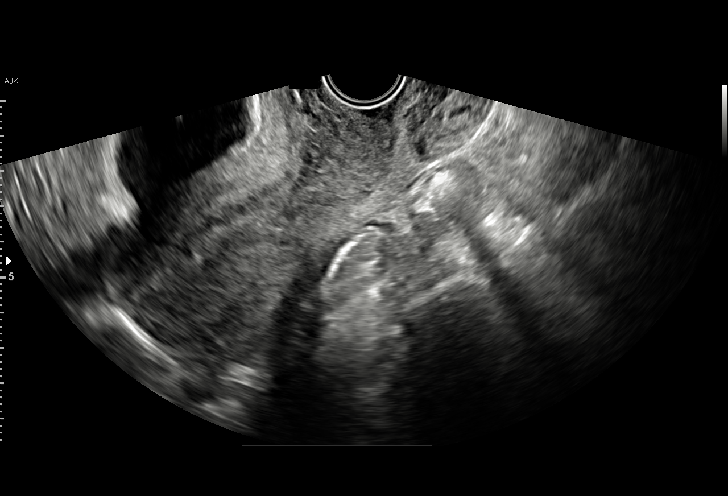
[im 3/33]
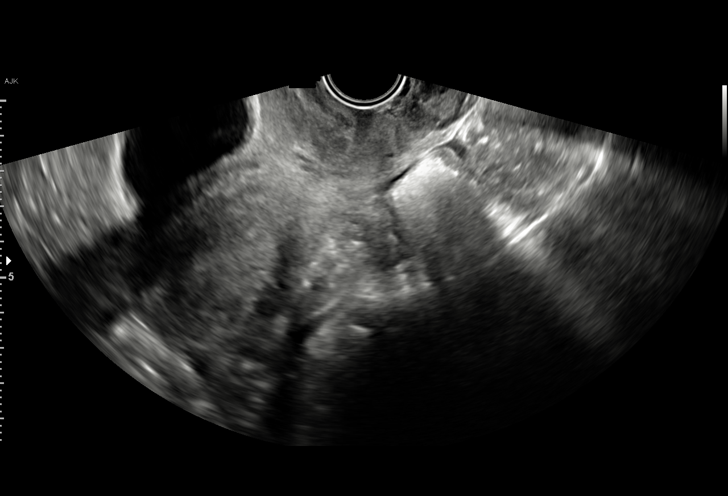
[im 5/33]
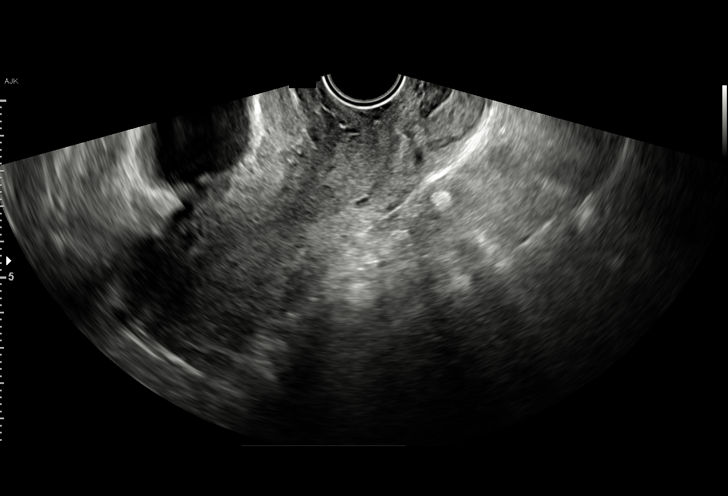
[im 8/33]
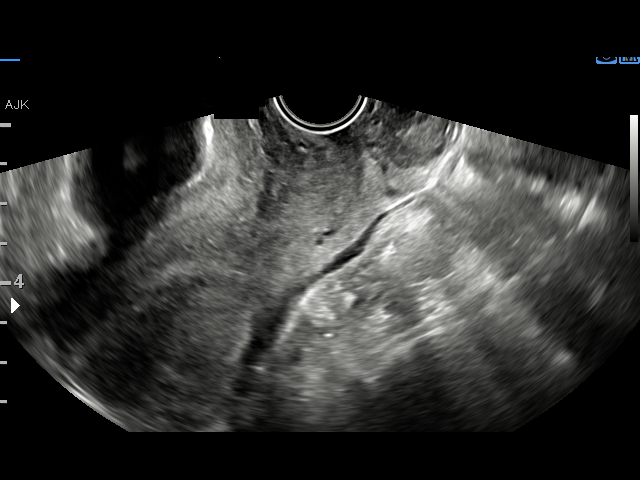
[im 10/33]
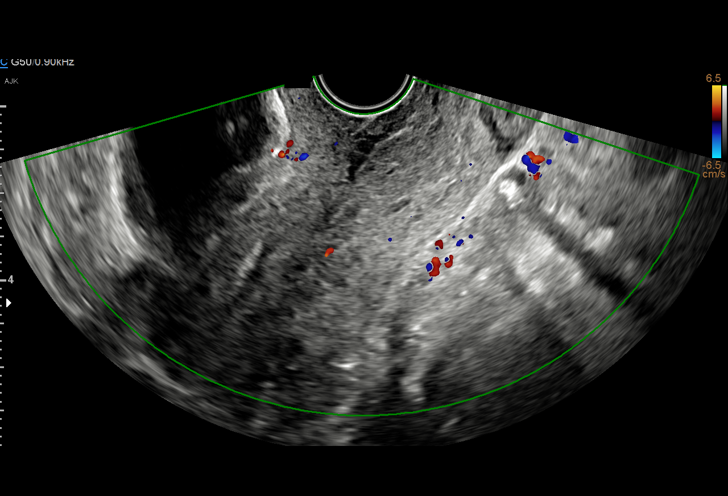
[im 12/33]
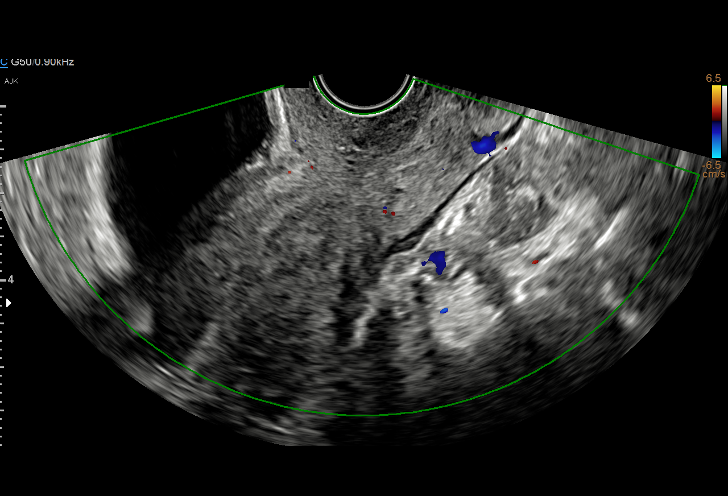
[im 15/33]
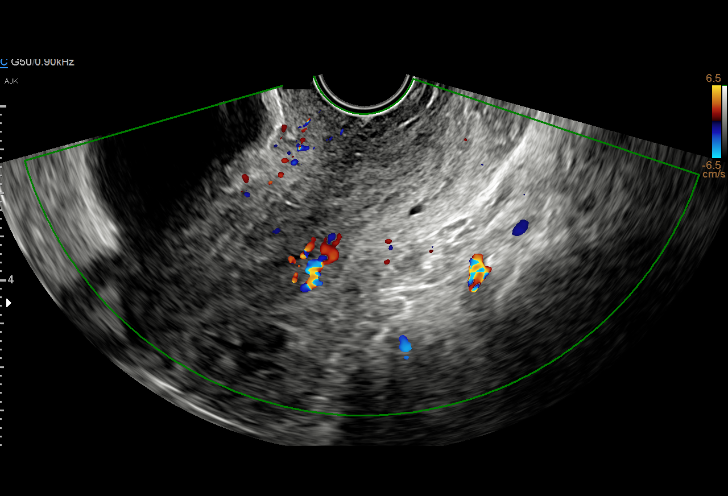
[im 17/33]
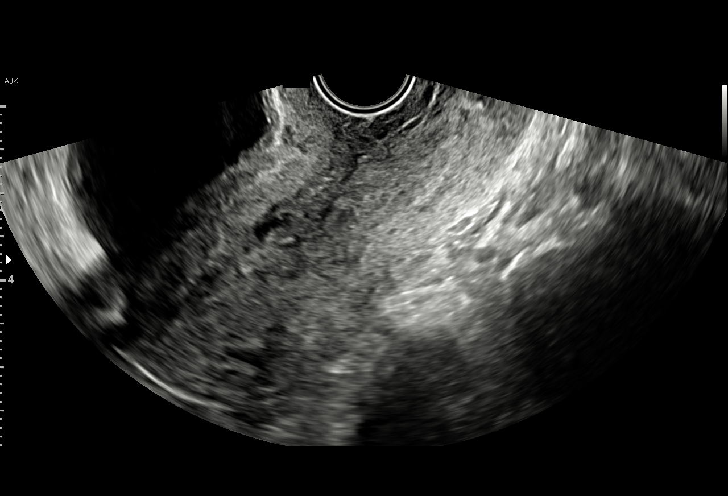
[im 18/33]
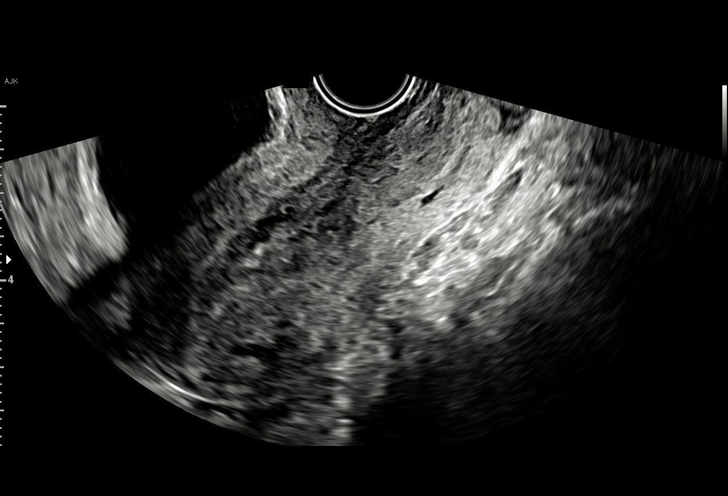
[im 21/33]
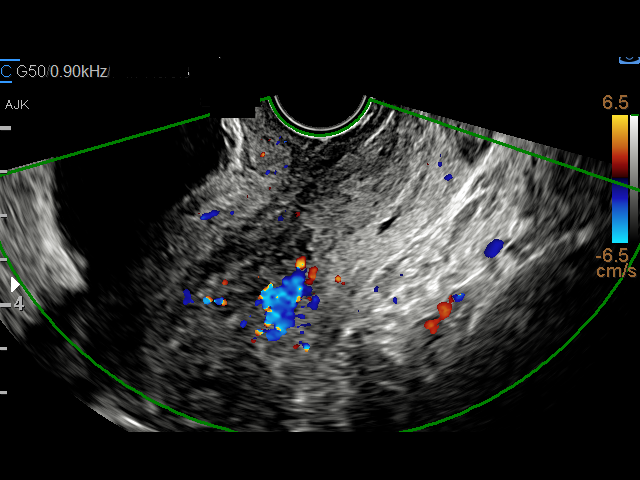
[im 23/33]
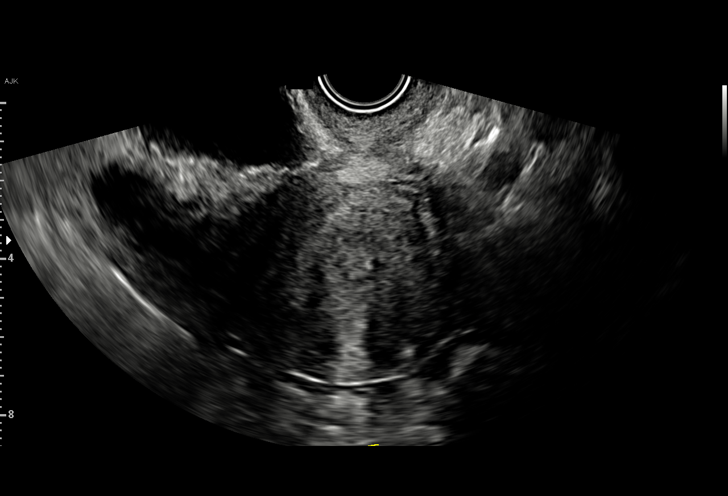
[im 25/33]
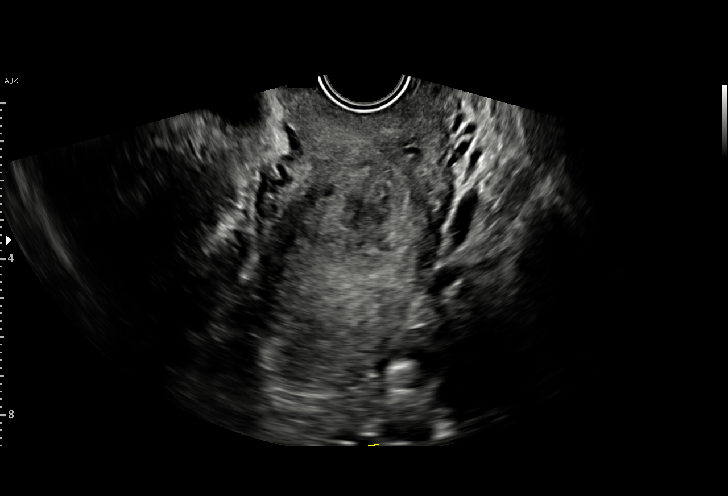
[im 28/33]
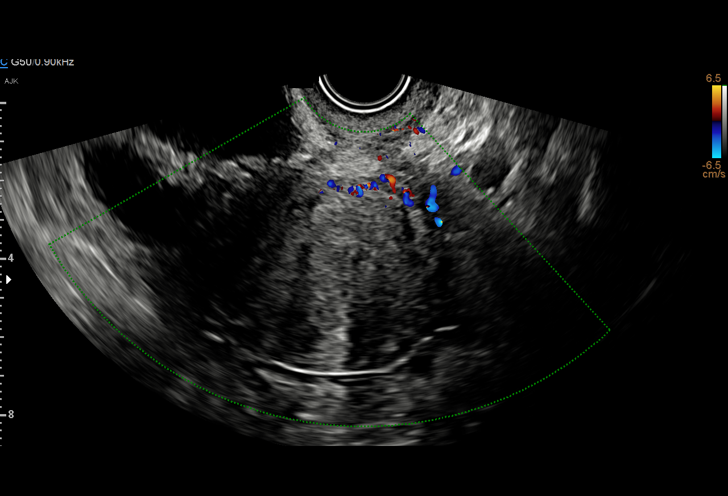
[im 30/33]
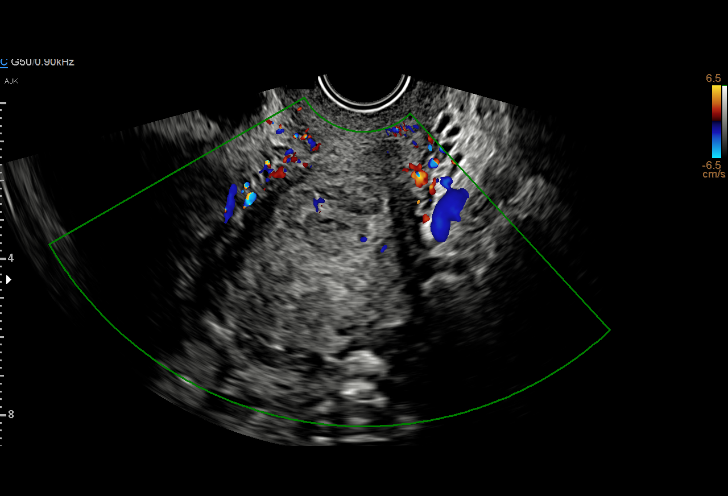
[im 33/33]
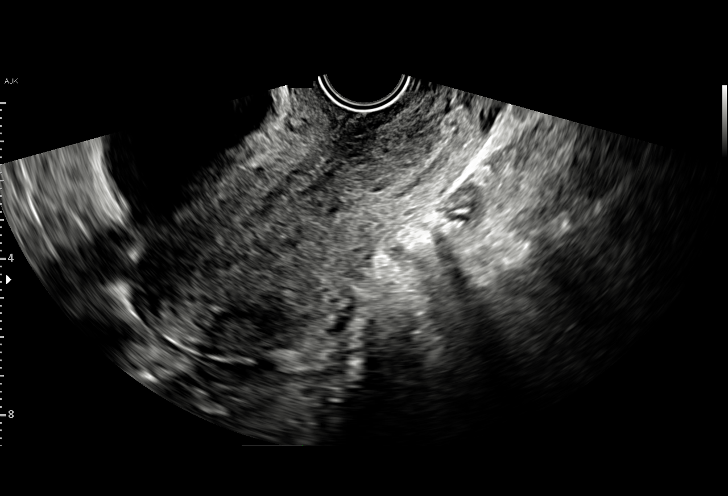

[15 of 28 positions shown; findings below may reference images not displayed]

FINDINGS: Intrauterine gestational sac: None.

Maternal uterus/adnexae: Thickened, heterogeneous endometrium with
areas of increased vascularity. The ovaries are not visualized.

Trace free fluid in the pelvis.
IMPRESSION: 1. The intrauterine gestational sac is no longer identified and the
endometrium is thickened and heterogeneous with increased
vascularity. Findings are consistent with ongoing abortion.

## 2024-05-25 ENCOUNTER — Emergency Department (HOSPITAL_BASED_OUTPATIENT_CLINIC_OR_DEPARTMENT_OTHER): Admitting: Radiology

## 2024-05-25 ENCOUNTER — Other Ambulatory Visit: Payer: Self-pay

## 2024-05-25 ENCOUNTER — Emergency Department (HOSPITAL_BASED_OUTPATIENT_CLINIC_OR_DEPARTMENT_OTHER): Admission: EM | Admit: 2024-05-25 | Discharge: 2024-05-25 | Disposition: A | Discharge status: Home / Self Care

## 2024-05-25 ENCOUNTER — Encounter (HOSPITAL_BASED_OUTPATIENT_CLINIC_OR_DEPARTMENT_OTHER): Payer: Self-pay

## 2024-05-25 MED ORDER — IBUPROFEN 600 MG PO TABS: 600.0000 mg | ORAL_TABLET | Freq: Four times a day (QID) | ORAL | 0 refills | Status: AC | PRN

## 2024-05-25 MED ORDER — IBUPROFEN 800 MG PO TABS
800.0000 mg | ORAL_TABLET | Freq: Once | ORAL | Status: AC
  Administered 2024-05-25: 800 mg via ORAL
  Filled 2024-05-25: qty 1

## 2024-05-25 MED ORDER — CYCLOBENZAPRINE HCL 10 MG PO TABS: 10.0000 mg | ORAL_TABLET | Freq: Two times a day (BID) | ORAL | 0 refills | Status: AC | PRN
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
